# Patient Record
Sex: Male | Born: 1955 | Race: White | Hispanic: No | Marital: Married | Smoking: Never smoker
Health system: Southern US, Community
[De-identification: ages and names within clinical notes are randomized; demographics above are authoritative.]

## PROBLEM LIST (undated history)

## (undated) DIAGNOSIS — E7800 Pure hypercholesterolemia, unspecified: Secondary | ICD-10-CM

## (undated) DIAGNOSIS — I359 Nonrheumatic aortic valve disorder, unspecified: Secondary | ICD-10-CM

## (undated) DIAGNOSIS — I719 Aortic aneurysm of unspecified site, without rupture: Secondary | ICD-10-CM

## (undated) DIAGNOSIS — I10 Essential (primary) hypertension: Secondary | ICD-10-CM

## (undated) DIAGNOSIS — I119 Hypertensive heart disease without heart failure: Secondary | ICD-10-CM

## (undated) DIAGNOSIS — Q2543 Congenital aneurysm of aorta: Secondary | ICD-10-CM

## (undated) DIAGNOSIS — J309 Allergic rhinitis, unspecified: Secondary | ICD-10-CM

## (undated) HISTORY — DX: Hypertensive heart disease without heart failure: I11.9

## (undated) HISTORY — PX: CARDIAC CATHETERIZATION: SHX172

## (undated) HISTORY — DX: Allergic rhinitis, unspecified: J30.9

## (undated) HISTORY — DX: Congenital aneurysm of aorta: Q25.43

## (undated) HISTORY — PX: OTHER SURGICAL HISTORY: SHX169

## (undated) HISTORY — DX: Aortic aneurysm of unspecified site, without rupture: I71.9

## (undated) HISTORY — DX: Essential (primary) hypertension: I10

## (undated) HISTORY — DX: Nonrheumatic aortic valve disorder, unspecified: I35.9

## (undated) HISTORY — DX: Pure hypercholesterolemia, unspecified: E78.00

---

## 2006-03-21 HISTORY — PX: AORTIC VALVE REPLACEMENT: SHX41

## 2006-07-21 ENCOUNTER — Encounter: Admission: RE | Admit: 2006-07-21 | Discharge: 2006-07-21 | Payer: Self-pay | Admitting: Cardiology

## 2006-07-26 ENCOUNTER — Ambulatory Visit (HOSPITAL_COMMUNITY): Admission: RE | Admit: 2006-07-26 | Discharge: 2006-07-26 | Payer: Self-pay | Admitting: Cardiology

## 2006-08-03 ENCOUNTER — Ambulatory Visit: Payer: Self-pay | Admitting: Cardiothoracic Surgery

## 2006-08-08 ENCOUNTER — Ambulatory Visit: Payer: Self-pay | Admitting: Cardiothoracic Surgery

## 2006-08-25 ENCOUNTER — Encounter: Admission: RE | Admit: 2006-08-25 | Discharge: 2006-08-25 | Payer: Self-pay | Admitting: Cardiothoracic Surgery

## 2006-08-25 ENCOUNTER — Ambulatory Visit: Payer: Self-pay | Admitting: Cardiothoracic Surgery

## 2006-09-12 ENCOUNTER — Ambulatory Visit: Payer: Self-pay | Admitting: Cardiothoracic Surgery

## 2006-09-12 ENCOUNTER — Inpatient Hospital Stay (HOSPITAL_COMMUNITY): Admission: RE | Admit: 2006-09-12 | Discharge: 2006-09-18 | Payer: Self-pay | Admitting: Cardiothoracic Surgery

## 2006-09-12 ENCOUNTER — Encounter: Payer: Self-pay | Admitting: Cardiothoracic Surgery

## 2006-09-13 ENCOUNTER — Ambulatory Visit: Admission: RE | Admit: 2006-09-13 | Discharge: 2006-09-13 | Payer: Self-pay | Admitting: Cardiothoracic Surgery

## 2006-10-06 ENCOUNTER — Ambulatory Visit: Payer: Self-pay | Admitting: Cardiothoracic Surgery

## 2006-10-12 ENCOUNTER — Encounter (HOSPITAL_COMMUNITY): Admission: RE | Admit: 2006-10-12 | Discharge: 2007-01-10 | Payer: Self-pay | Admitting: Cardiology

## 2006-11-24 ENCOUNTER — Ambulatory Visit: Payer: Self-pay | Admitting: Cardiothoracic Surgery

## 2007-08-31 ENCOUNTER — Ambulatory Visit: Payer: Self-pay | Admitting: Cardiothoracic Surgery

## 2007-08-31 ENCOUNTER — Encounter: Admission: RE | Admit: 2007-08-31 | Discharge: 2007-08-31 | Payer: Self-pay | Admitting: Cardiothoracic Surgery

## 2008-07-31 IMAGING — CT CT ANGIO CHEST
2 of 4 series · 18 of 36 positions shown · IV contrast ([ID] OMNI 300)
Comparison: Plain films 06-27/3117.  CT 08/25/2006.

CLINICAL DATA: AORTIC VALVE DISEASE WITH DILATED AORTIC ROOT.

CHEST CTA WITH AND WITHOUT CONTRAST
TECHNIQUE: Multidetector CTA  imaging of the chest was performed
following the standard protocol both before and during the
administration of IV contrast. Multiplanar reformats were
constructed for perspective.
Contrast: 100 ml Dmnipaque-AUU

[Series 5: angio · axial · 0.70mm/px · z∈[-308,+4]mm · 15 of 141 slices shown]
[im 8/141  lung]
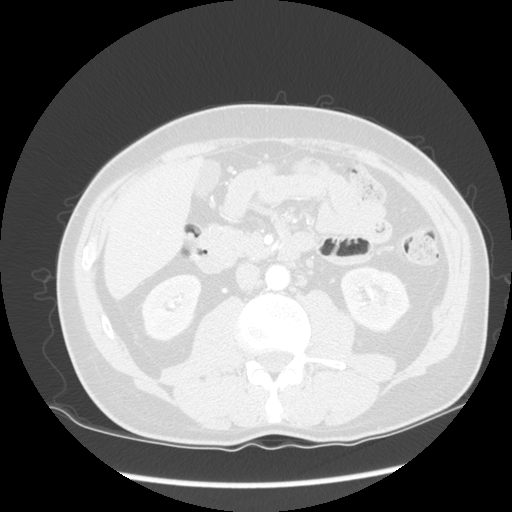
[im 16/141  mediastinal]
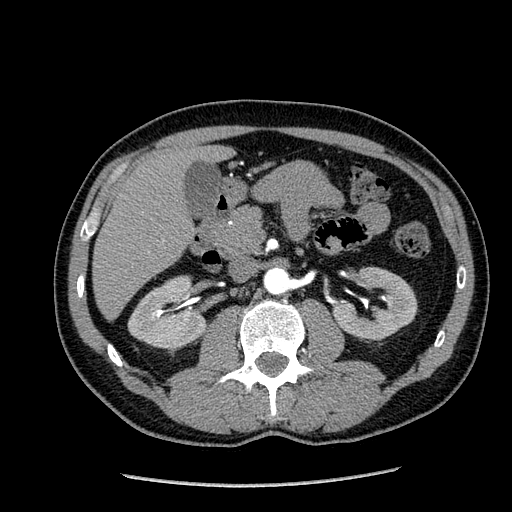
[im 24/141  lung]
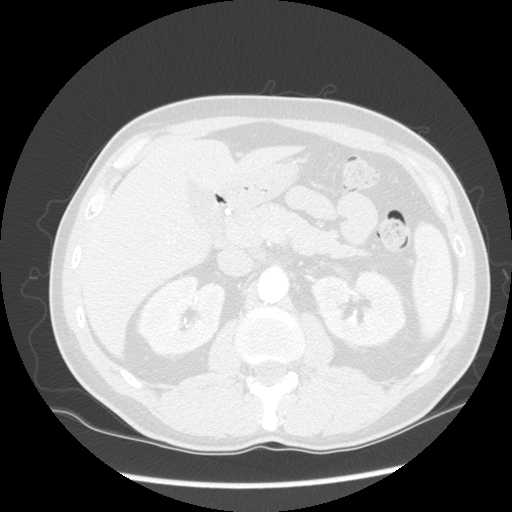
[im 32/141  mediastinal]
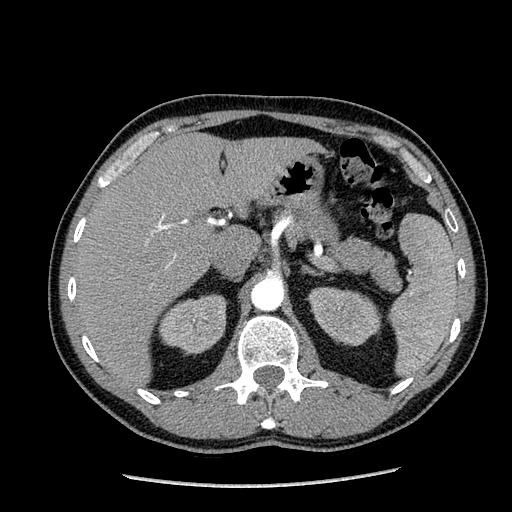
[im 47/141  lung]
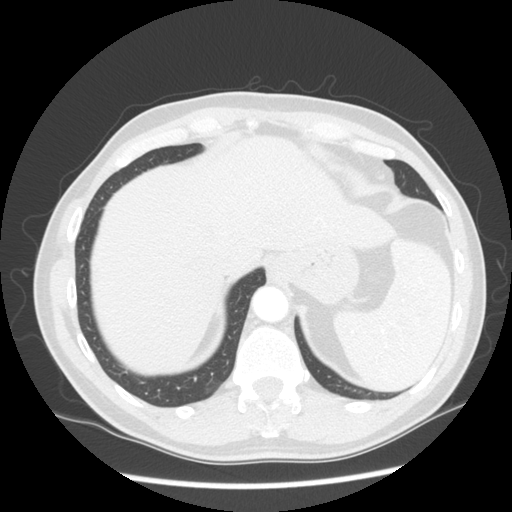
[im 55/141  mediastinal]
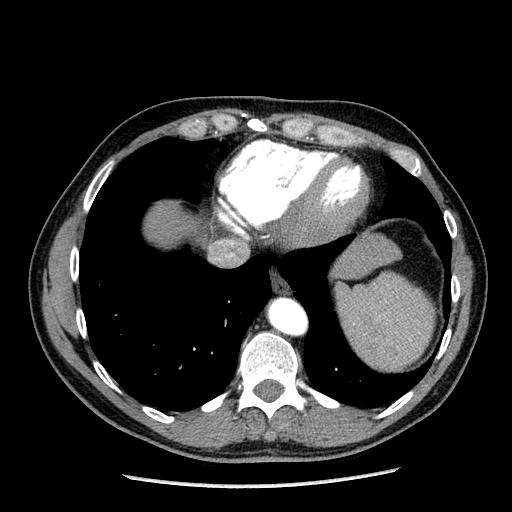
[im 63/141  lung]
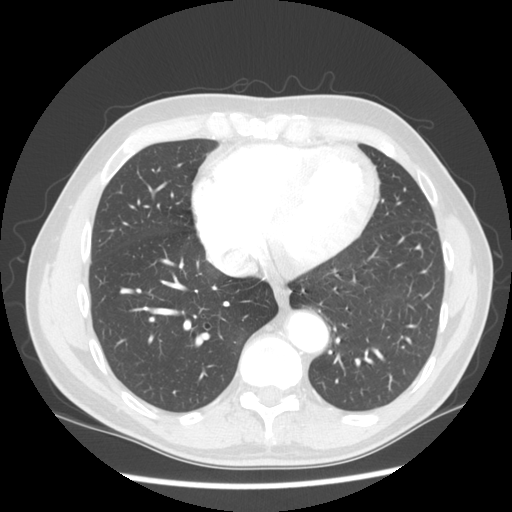
[im 71/141  mediastinal]
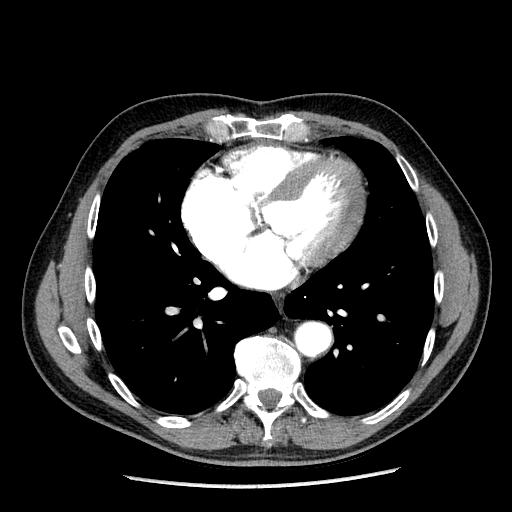
[im 78/141  lung]
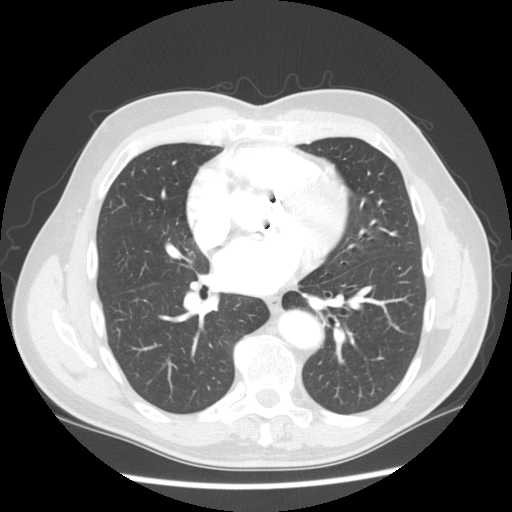
[im 86/141  mediastinal]
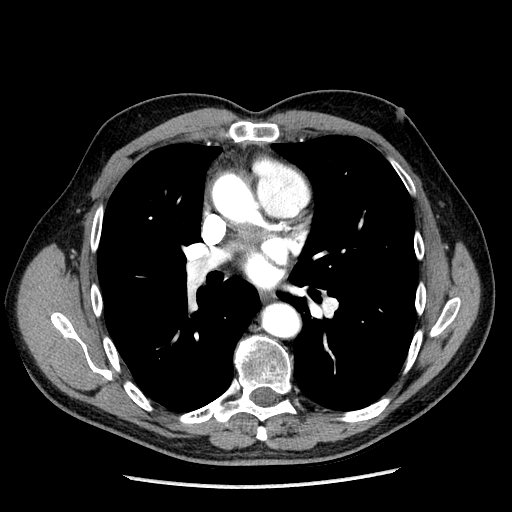
[im 94/141  lung]
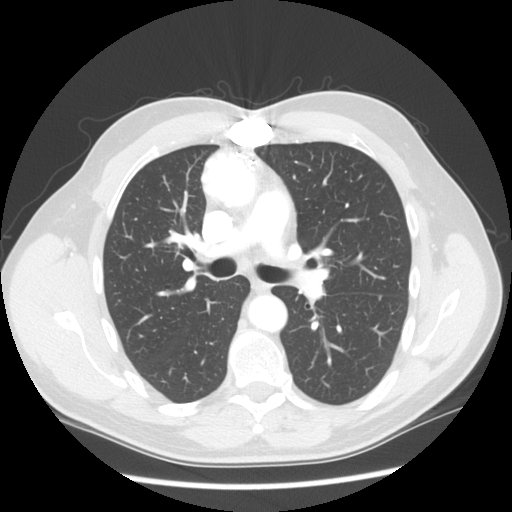
[im 109/141  mediastinal]
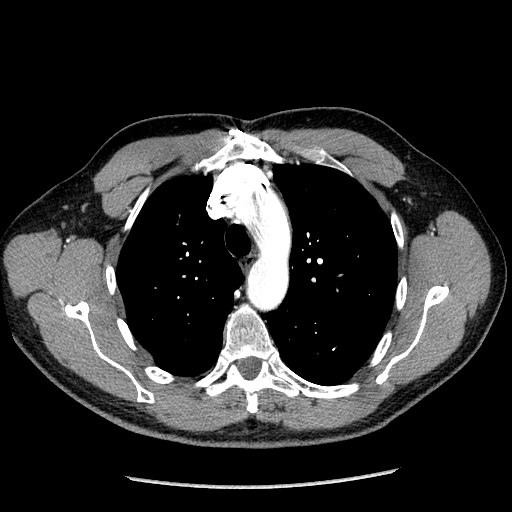
[im 117/141  lung]
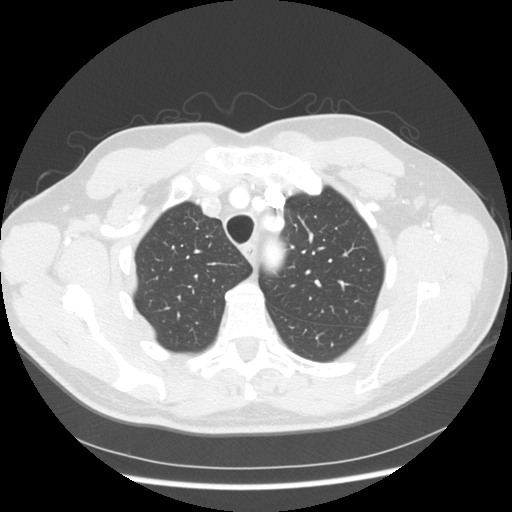
[im 125/141  mediastinal]
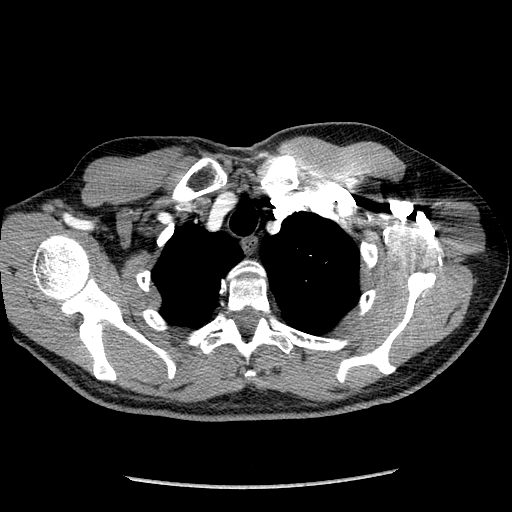
[im 133/141  lung]
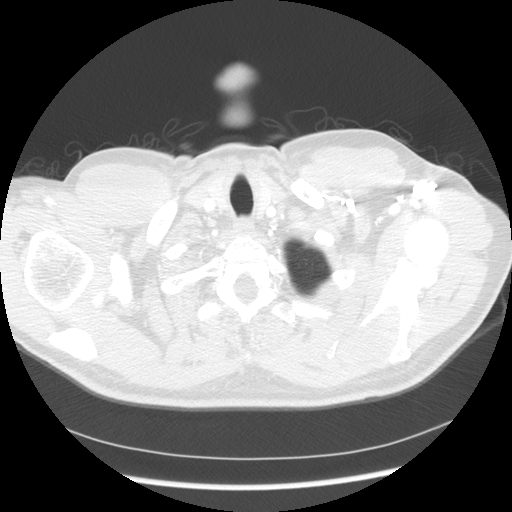

[Series 602: sagittal body · sagittal · 0.70mm/px · 3 of 145 slices shown]
[im 29/145  mediastinal]
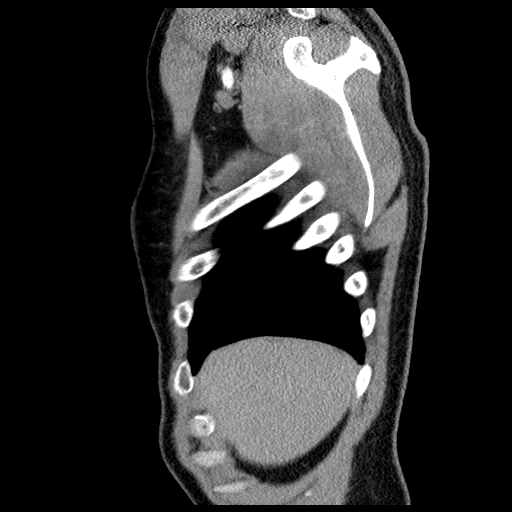
[im 58/145  mediastinal]
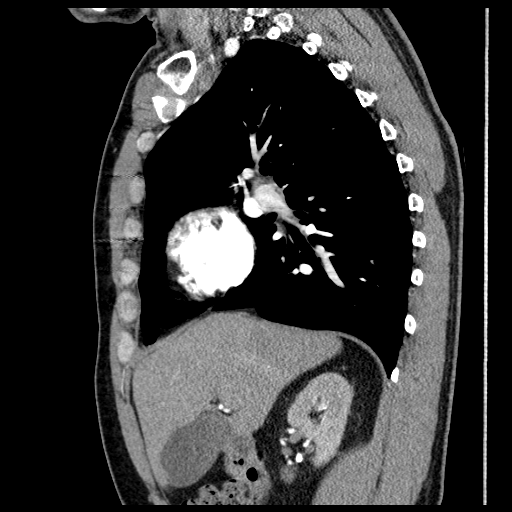
[im 87/145  mediastinal]
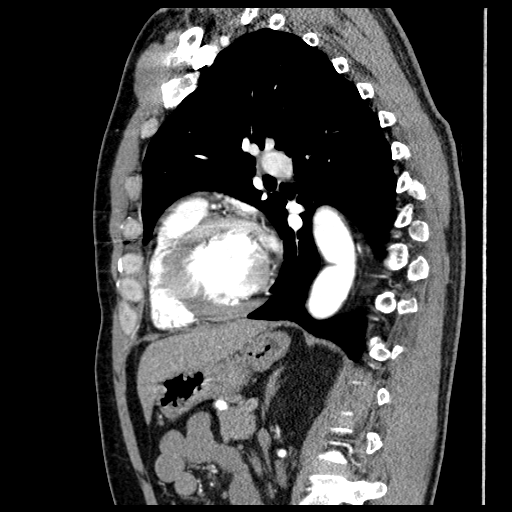

[18 of 36 positions shown; findings below may reference images not displayed]

FINDINGS: Lung windows demonstrate minimal pleural irregularity of
the left major fissure on image 25 of series 6, likely related to a
subpleural lymph node.  No suspicious nodules or airspace disease.

Soft tissue windows demonstrate no evidence of intramural hematoma.
Normal caliber of the great vessels.  Interval aortic valve
replacement with replacement of the proximal ascending aorta.  No
evidence of postoperative complication.  The distal ascending and
proximal transverse aorta are mildly prominent, measuring between
3.4 and 3.8 cm just above the surgical sutures.  Coronal image 43
and sagittal image 69.  On axial image 45, the ascending aorta
measures 4.1 x 3.7 cm just above the graft site.  The transverse
and descending aorta are normal in caliber.

Mild cardiomegaly with left ventricular hypertrophy.  No
pericardial or pleural effusion.  Advanced coronary artery
atherosclerosis including within the proximal LAD and either a
ramus branch or early D1 branch on image 57 of series 5. No
mediastinal or hilar adenopathy.

Limited abdominal imaging demonstrates 2 right renal arteries. No
other significant findings. Mild decreased height of mid thoracic
vertebral bodies is similar to on the prior exam and likely a
chronic finding.
IMPRESSION: 1.  Interval aortic valve and ascending aortic repair as described.
Mild prominence of the more distal ascending aorta just above the
graft insertion site.  No evidence of postoperative complication.
2.  No acute process in the chest.
3.  Age advanced coronary artery atherosclerosis as described
above.

## 2009-11-02 ENCOUNTER — Encounter: Admission: RE | Admit: 2009-11-02 | Discharge: 2009-11-02 | Payer: Self-pay | Admitting: Family Medicine

## 2010-04-11 ENCOUNTER — Encounter: Payer: Self-pay | Admitting: Cardiothoracic Surgery

## 2010-08-03 NOTE — Assessment & Plan Note (Signed)
OFFICE VISIT   Steven Bowers, Steven Bowers  DOB:  [birth year]                                        October 06, 2006  CHART #:  [id number]   CURRENT PROBLEMS:  1. Status post aortic root replacement with a valve-conduit Bentall      procedure on September 12, 2006.  2. History of aortic insufficiency with aortic root aneurysm.   HOME MEDICATIONS:  1. Coumadin 7.5 mg a day.  2. Lopressor 25 mg b.i.d.  3. Iron 150 mg daily.  4. Folic acid 1 mg daily.  5. Tylenol for p.r.n. pain.  6. Aspirin 81 mg daily.   PRESENT ILLNESS:  The patient is a 55 year old male who had severe  aortic insufficiency with an aortic root aneurysm.  He underwent a  Bentall root replacement with a St. Jude 23 mm valve conduit with a  resection of a fusiform ascending aneurysm and reimplantation of the  coronaries.  He did well following surgery and remained in sinus rhythm,  and was discharged home on the 5th postoperative day on the above  medications.  Since returning home he has had progressive improvement in  his exercise tolerance and decreased incisional discomfort.  His  Coumadin has been regulated by the [facility] pharmacist, and he  is currently taking 7.5 mg a day, with last INR approximately 2.0.  He  denies any bleeding complications.  He denies any angina or symptoms of  CHF.  The surgical incision has healed well.   PHYSICAL EXAMINATION:  Blood pressure 128/80, pulse 78, respirations 18,  saturation 100% on room air.  General appearance is that of a pleasant,  middle-aged male in no distress.  Breath sounds are clear and equal.  The sternum is stable.  Cardiac rhythm is regular and there is a sharp  aortic valve closure sound without aortic insufficiency.  There is no S3  gallop, there is no friction rub.  Ankles show no edema and neurological  exam is intact.  Peripheral pulses are intact.   LABORATORY DATA:  A PA and lateral chest x-ray shows clear lung fields  with a  stable cardiac silhouette, and the sternal wires are  nondisplaced.   IMPRESSION AND PLAN:  The patient has done well early following a  Bentall procedure for a root aneurysm with severe aortic insufficiency.  He will be able to start driving and routine low-level daily activities.  He is walking 20 minutes twice a day.  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  He  will probably need a CT angiogram of the thoracic aorta approximately 1  year after the operation which we will arrange.  Thank you for the  opportunity to participate in his care.   Kerin Perna, M.D.  Electronically Signed   PV/MEDQ  D:  10/06/2006  T:  10/07/2006  Job:  [id number]   cc:   Armanda Magic, M.D.  Robert A. Nicholos Johns, M.D.

## 2010-08-03 NOTE — Cardiovascular Report (Signed)
Steven Bowers, Steven Bowers              ACCOUNT NO.:  [id number]   MEDICAL RECORD NO.:  [id number]          PATIENT TYPE:  OIB   LOCATION:  2852                         FACILITY:  [facility]   PHYSICIAN:  Armanda Magic, M.D.     DATE OF BIRTH:  February 20, 1956   DATE OF PROCEDURE:  07/26/2006  DATE OF DISCHARGE:                            CARDIAC CATHETERIZATION   REFERRING PHYSICIAN:  Molly Maduro A. Nicholos Johns, M.D.   PROCEDURE:  Left heart catheterization, coronary angiography, left  ventriculography, aortography.   OPERATOR:  Armanda Magic, M.D.   INDICATIONS:  Aortic insufficiency.   COMPLICATIONS:  None.   INTRAVENOUS MEDICATIONS:  Versed 2 mg IV.   HISTORY OF PRESENT ILLNESS:  This is a very pleasant 55 year old white  male with a history of bicuspid aortic valve and aortic insufficiency  dating back to an echo in 2000.  He presented recently for further  evaluation.  He is asymptomatic, but a 2-D echocardiogram showed severe  aortic insufficiency with mildly dilated left ventricular cavity and  mild LV dysfunction, as well as dilated aortic root.  He now presents  for cardiac catheterization.   DESCRIPTION OF PROCEDURE:  The patient is brought to the cardiac  catheterization laboratory in a fasting, non sedated state.  Informed  consent was obtained.  The patient was connected to continuous heart  rate and pulse oximetry monitoring and blood pressure monitoring.  The  right groin was prepped and draped in a sterile fashion and 1% Xylocaine  was used for local anesthesia.  Using modified Seldinger technique a 6-  French sheath was placed in the right femoral artery.  Under  fluoroscopic guidance a 6-French JL-4 catheter was placed in left  coronary artery but we could not adequately engage the coronary ostium.  The catheter was exchanged out over a guidewire for a 6-French JR-4  right coronary artery catheter.  This again could not engage the  coronary ostium.  This catheter was exchanged out  over a guidewire for 6-  [place] JL-5 catheter which again could not successfully engage the  coronary ostium of the left coronary artery.  The catheter was then  exchanged out over a guidewire a 6-French angled pigtail catheter was  placed in left ventricular cavity.  Under fluoroscopy left  ventriculography was performed in the 30 degrees RAO view using a total  of 30 mL of contrast at 15 mL per second.  The catheter was then pulled  back across the aortic valve with no significant gradient noted.  The  catheter was then pulled back into the aortic root and an aortography  was performed using a total of 30 mL of contrast at 20 mL per second.  The catheter was then removed over a guide wire and a 6-French JL-6  catheter was placed into the left coronary ostium and multiple Scinti  films taken at 30 degree RAO and 40 LAO views.  Catheter was then  exchanged out over a guidewire for 6-French Williams right catheter  which could not successfully engage the coronary ostium.  The catheter  was exchanged out over a guidewire for 6-French JR-6  catheter which  successfully engaged the right coronary ostium.  Multiple Scinti films  were taken at 30 degree RAO and 40 degree LAO views.  This catheter was  then removed over a guidewire and at the end of the procedure all  catheters and sheaths were removed.  Manual compression was performed  until an adequate hemostasis was obtained.  The patient was transferred  back to room in stable condition.   RESULTS:  Left ventriculography shows low normal LV function, EF 50%,  dilated aortic root with severe AI by echo; by cath the contrast did  opacify, but lightly opacified, the left ventricle in the first cardiac  cycle.  Left ventricular pressure 126/3 mmHg; aortic pressure 125/58  mmHg.   The right coronary artery is widely patent and bifurcates into the  posterior descending artery and posterior lateral artery, both of which  are widely patent.    Left coronary artery is widely patent and trifurcates into a left  anterior descending artery, ramus branch and left circumflex artery.   The left anterior descending artery is widely patent throughout its  course at the apex giving rise to two diagonal branches, both of which  are widely patent.   The ramus is a large vessel that is widely patent.   The left circumflex is widely patent throughout its course in the AV  groove, giving rise to a first obtuse marginal branch which is widely  patent and terminating in a second obtuse marginal branch, which is  widely patent.   ASSESSMENT:  1. Normal coronary arteries.  2. Low normal left ventricular function.  3. Moderate to severe aortic insufficiency by echocardiogram with      bicuspid aortic valve.  4. Dilated aortic root.   PLAN:  CVTS referral for aortic valve and root replacement.      Armanda Magic, M.D.  Electronically Signed     TT/MEDQ  D:  07/26/2006  T:  07/26/2006  Job:  [id number]   cc:   Molly Maduro A. Nicholos Johns, M.D.

## 2010-08-03 NOTE — Discharge Summary (Signed)
NAME:  Steven Bowers, Steven Bowers NO.:  [id number]   MEDICAL RECORD NO.:  [id number]          PATIENT TYPE:  INP   LOCATION:  2017                         FACILITY:  [facility]   PHYSICIAN:  Kerin Perna, M.D.  DATE OF BIRTH:  [birth year]   DATE OF ADMISSION:  09/12/2006  DATE OF DISCHARGE:  09/18/2006                               DISCHARGE SUMMARY   ADDENDUM:  The patient was tentatively felt to be stable for discharge  on or about September 18, 2006.  Please see the previous dictation for the  majority of the details of this hospitalization.  From the date September 15, 2006, to September 18, 2006, the patient remained quite stable and he  required daily adjustments of his Coumadin dosing in attempts to get  therapeutic INR.  On September 18, 2006, his INR was 1.4 and it was felt at  that time that it was acceptable for him to be discharged with a dose of  5 mg daily and the dose on the day of discharge 7.5 mg daily.  He had no  significant other difficulties or issues related to his hospitalization  otherwise not mentioned on the previous dictation.   DISCHARGE MEDICATIONS:  1. Aspirin 81 mg daily.  2. Lopressor 25 mg twice daily.  3. Altace 2.5 mg daily.  4. Tylox 1 or 2 every 4 to 6 hours as needed for pain.  5. Coumadin 5 mg daily.  6. NU-IRON 150 mg daily.  7. Folic acid 1 mg daily.  8. He is to continue his previous vitamins as listed.   INR was to be obtained on September 20, 2006, at Dr. Norris Cross office with an  appointment to be made with Dr. Mayford Knife in 2 weeks.  Followup with Dr.  Donata Clay was to be October 06, 2006, 11:30.      Rowe Clack, P.A.-C.      Kerin Perna, M.D.  Electronically Signed    WEG/MEDQ  D:  11/09/2006  T:  11/09/2006  Job:  [id number]   cc:   Gerlene Burdock A. Alanda Amass, M.D.  Kerin Perna, M.D.  Armanda Magic, M.D.

## 2010-08-03 NOTE — H&P (Signed)
HISTORY AND PHYSICAL EXAMINATION   August 25, 2006   Re:  REISS, MOWREY B        DOB:  22-Jul-1955   ADMISSION DIAGNOSES:  1. Moderate to severe aortic insufficiency with a bicuspid aortic      valve.  2. Fusiform ascending aortic aneurysm.  3. 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   HISTORY OF PRESENT ILLNESS:  Mr. Daimian Sudberry is a 55 year old white  male who has a known history of aortic insufficiency from a bicuspid  aortic valve and was catheterized by Dr. Mayford Knife in May showing normal  coronaries with a dilated aortic root measuring 4.5 cm.  The patient's  father died at age 80 of a sudden cardiac death, possible aortic  dissection.  The patient underwent a CT angiogram of his thoracic aorta,  which showed normal caliber of the aortic arch and descending thoracic  aorta.  His 2 D echocardiogram shows normal LV function without mitral  regurgitation.  The end-diastolic diameter of the left ventricle was  increased at 6.5 cm.  The patient returns today to discuss the operation  and schedule a date for surgery for a Bentall procedure.  Because of his  age of 58 years, a mechanical valve conduit has been recommended.   PAST MEDICAL HISTORY:  1. Moderate to severe AI with fusiform ascending aneurysm.  2. Varicose veins of the right lower extremity.  3. No known drug allergies.   CURRENT MEDICATIONS:  1. Aspirin 81 mg.  2. Multivitamin.  3. Vitamin D.  4. Vitamin C.   SOCIAL HISTORY:  The patient works in maintenance for the [employer] and is on a summer leave of absence for his surgery  at this point.  He does not smoke or use alcohol.  He is married.   FAMILY HISTORY:  His father died of a sudden cardiac death at age 20.  No other siblings or family members have known bicuspid aortic valve  disease.   REVIEW OF SYSTEMS:  Negative constitutional systems of fever or weight  loss.  ENT:  Negative, and he see a dentist  regularly.  He always takes  amoxicillin dental prophylaxis.  He has no history of thoracic trauma.  UROLOGIC:  Negative for BPH or kidney disease.  HEMATOLOGIC:  Negative  for bleeding disorder or blood transfusion.  He has had no evidence of  severe headaches or cerebral aneurysm disease.  He has no  contraindication to chronic Coumadin therapy after review of his past  medical history or review of systems.   PHYSICAL EXAMINATION:  VITAL SIGNS:  The patient is 5 feet 7 inches and  weighs 155 pounds.  Blood pressure 144/60, pulse 60, respirations 18,  saturation 98%.  GENERAL:  He is alert and oriented, pleasant.  He is accompanied by his  wife.  HEENT:  Normocephalic.  Dentition is good.  NECK:  Without JVD, mass or carotid bruit.  LYMPHATIC:  No palpable supraclavicular or cervical adenopathy.  CHEST:  Thorax is without deformity and with clear breath sounds  bilaterally.  CARDIAC:  A 3-4/6 systolic ejection murmur.  There is no gallop.  ABDOMEN:  Soft without pulsatile mass.  EXTREMITIES:  No clubbing, cyanosis, or edema.  Peripheral pulses are  2+, and there is no venous insufficiency of the left leg but severe  venous varicosities of the right leg.  NEUROLOGIC:  Intact.   LABORATORY DATA:  I reviewed the CT scan he had today and  the transverse  arch appears to be of normal caliber.  It appears that a Bentall  procedure will be the best therapy and that the ascending aorta should  be able to accept a clamp at the innominate artery.  This would  eliminate the need for circulatory arrest.   IMPRESSION AND PLAN:  The patient has moderate to severe AI with a  fusiform ascending aneurysm and a dilated left ventricle.  He would  benefit from a Bentall procedure, which will be scheduled for September 12, 2006.  I have discussed the procedure in detail with the patient and his  wife, including the alternatives to surgery and the risks involved.  He  understands that a mechanical valve  conduit would necessitate a lifelong  commitment to Coumadin  anticoagulation.  All questions have been addressed and the patient  understands and agrees to proceed.   Kerin Perna, M.D.  Electronically Signed   PV/MEDQ  D:  08/25/2006  T:  08/26/2006  Job:  [id number]   cc:   Armanda Magic, M.D.  [facility] Office

## 2010-08-03 NOTE — Assessment & Plan Note (Signed)
OFFICE VISIT   ZANE, PELLECCHIA  DOB:  [birth year]                                        November 24, 2006  CHART #:  [id number]   CURRENT PROBLEMS:  1. Status post aortic root replacement with a Bentall procedure, September 12, 2006.  2. History of AI with ascending root aneurysm.   HOME MEDICATIONS:  1. Coumadin 7.5 mg daily.  2. Lopressor 25 mg b.i.d.  3. Tylenol p.r.n. pain.  4. Aspirin 81 mg a day.   PRESENT ILLNESS:  The patient is a 55 year old male status post Bentall  procedure with a mechanical valve-conduit who is doing well now, 2-1/2  months postop.  He has completed his rehab and is feeling very strong,  and I feel is able to return to work.  He was given a return to work  form for later in September.  He has had no bleeding complications from  the Coumadin, and on exam his valve sounds fine with a stable blood  pressure of 118/70, sinus rhythm of 72 per minute.  He will be followed  by his cardiologist, Dr. Armanda Magic, and will be seen back here with a  CT angiogram of the chest next summer.   Kerin Perna, M.D.  Electronically Signed   PV/MEDQ  D:  11/24/2006  T:  11/25/2006  Job:  [id number]

## 2010-08-03 NOTE — Discharge Summary (Signed)
NAMELAURENCE, CROFFORD NO.:  [id number]   MEDICAL RECORD NO.:  [id number]          PATIENT TYPE:  INP   LOCATION:  2017                         FACILITY:  [facility]   PHYSICIAN:  Kerin Perna, M.D.  DATE OF BIRTH:  1955-07-10   DATE OF ADMISSION:  09/12/2006  DATE OF DISCHARGE:                               DISCHARGE SUMMARY   FINAL DIAGNOSIS:  Severe aortic insufficiency with aortic root aneurysm.   IN-HOSPITAL DIAGNOSIS:  Acute blood loss anemia, postoperatively.   SECONDARY DIAGNOSIS:  Varicose veins, right lower extremity.   IN-HOSPITAL OPERATIONS AND PROCEDURES:  _Bentall _  procedure with  aortic root replacement using St. Jude 23-mm conduit, reimplantation of  coronaries, and resection of fusiform aneurysm in the ascending aorta.   HISTORY AND PHYSICAL:  The patient is a 55 year old male with severe  aortic insufficiency and LV dilatation and early symptoms of dyspnea on  exertion.  Catheterization done showed severe AI which was confirmed by  echocardiogram.  His coronary arteries were within normal limits.  The  patient underwent CT angiogram of his thoracic aorta which demonstrated  ascending aorta measuring approximately 4.5-cm with a normal 4-cm arch,  and descending thoracic aorta.  There were no other valvular  abnormalities.  Dr. Donata Clay discussed with the patient undergoing  aortic root replacement.  He discussed the risks and benefits with the  patient.  The patient acknowledged his understanding and agreed to  proceed.  The surgery was scheduled for September 12, 2006.  For details of  the patient's past medical history and physical exam, please see  dictated H&P.   HOSPITAL COURSE:  The patient was taken to the operating room on September 12, 2006, where he underwent _Bentall__  procedure with aortic root  replacement using a St. Jude 23-mm conduit, reimplantation of the  coronaries, and resection of a fusiform aneurysm of his ascending aorta.  The patient tolerated the procedures well and was returned to the  intensive care unit in stable condition.  The patient's postoperative course was pretty much unremarkable.  Immediately postoperatively the patient was noted to be hemodynamic  stable.  He was extubated the evening of surgery.  Post extubation, the  patient noted to be alert and oriented  x4, neuro intact.  During the  patient's postoperative course, vital signs were monitored closely.  They remained stable.  The patient remained afebrile _with_  him being  able to be weaned off oxygen sating greater than 90% on room air.  From  a pulmonary standpoint, postoperative chest x-ray remained clear with no  pneumothorax or significant pleural effusions.  The patient remained in  a normal sinus rhythm postoperatively.  Incisions are clean, dry,  intact, and healing well.  The patient did develop acute blood loss  anemia with hemoglobin/hematocrit dropping to 9.3 and 26.8 on post-op  day #3.  The patient was asymptomatic and this was followed closely.  Postoperatively, the patient was out of bed ambulating well.  He was  tolerating a diet well with no nausea or vomiting noted.  The patient  was transferred out to  2000 post-op day #2.  He was started on Coumadin  post-op day #2 for the [facility]. Jude aortic valve/conduit.  Daily PT INR were  obtained.  ___Baseline  INR is 1.1.  The patient is tentatively ready  for discharge home in the next 1-2 days if he remains stable.   FOLLOWUP APPOINTMENTS:  1. Followup appointment has been arranged with Dr. Donata Clay for October 06, 2006 at 11:30 a.m.  2. The patient will need to follow up with Dr. Mayford Knife in 2 weeks.  He      will need to contact Dr. Mayford Knife to arrange this appointment.  Dr.      Mayford Knife will be managing the patient's Coumadin and the patient will      need to obtain PT INR two days post discharge.   ACTIVITY:  1. The patient instructed no driving till released to do so.  2.  No heavy lifting over 10 pounds.  3. He is told to ambulate 3-4 times per day, progress as tolerated.  4. Continue breathing exercises.   INCISIONAL CARE:  The patient is told to shower, washing his incisions  using soap and water.  He is to contact the office if he develops any  drainage or opening from any of his incision sites.   DIET:  The patient is educated on diet to be low-fat, low-salt.   DISCHARGE MEDICATIONS:  1. Aspirin 81 mg daily.  2. Lopressor 25 mg b.i.d.  3. Altace 2.5 mg daily.  4. Coumadin, will be dosed based on the patient's discharge PT INR      level.  5. Nu-Iron 150 mg daily.  6. Folic acid 1 mg daily.  7. Multivitamin daily.  8. Tylox 1-2 tabs q.4-6h. p.r.n. pain.      Theda Belfast, [place]      Kerin Perna, M.D.  Electronically Signed    KMD/MEDQ  D:  09/15/2006  T:  09/15/2006  Job:  [id number]   cc:   Gerlene Burdock A. Alanda Amass, M.D.  Armanda Magic, M.D.

## 2010-08-03 NOTE — Assessment & Plan Note (Signed)
OFFICE VISIT   ARAS, ALBARRAN  DOB:  09-01-1955                                        September 01, 2007  CHART #:  [id number]   CURRENT PROBLEMS:  1. 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  2. Chronic Coumadin therapy for the mechanical prosthetic valve.  3. Mild hypertension.   HISTORY OF PRESENT ILLNESS:  The patient returns for a 1-year followup  after undergoing a mechanical valve - conduit aortic root replacement  for AI and an ascending aneurysm.  He has done well since the surgery  and is back to work full-time for the [employer].  He has had no chest pain or symptoms of CHF or  complications from his Coumadin.  He has had a postoperative echo by Dr.  Mayford Knife and was told the valve appears to be functioning well.   CURRENT MEDICATIONS:  Coumadin, aspirin 81 mg, pravastatin 40 mg, and  Lopressor 25 mg b.i.d.   PHYSICAL EXAMINATION:  Blood pressure 135/80, pulse 60, respirations 18,  and saturation 99%.  He is alert and pleasant.  Breath sounds are clear  and equal.  The sternum is well healed.  There is a sharp valve click  sound and no murmur of aortic insufficiency.  His heart rhythm is  regular.  There is no peripheral edema.   LABORATORY DATA:  A CT angiogram of his thorax is reviewed to assess the  residual aortic arch and descending thoracic aorta.  The valve - conduit  is completely normal in appearance without evidence of false aneurysm.  The remainder arch and descending thoracic aorta are of normal caliber,  approximately 3 cm in diameter.   IMPRESSION AND PLAN:  The patient has had a successful replacement of  the mechanical valve - conduit that should last him indefinitely.  There  is no evidence of further aortic anomalies or abnormalities.  He will  return to the care of his medical  physicians.  I would be happy to see  this very nice gentleman in the future for any surgical issues.   Kerin Perna, M.D.  Electronically Signed   PV/MEDQ  D:  09/01/2007  T:  09/01/2007  Job:  [id number]   cc:   Armanda Magic, M.D.  Robert A. Nicholos Johns, M.D.

## 2010-08-03 NOTE — Op Note (Signed)
NAME:  Steven Bowers, Steven Bowers NO.:  [id number]   MEDICAL RECORD NO.:  [id number]          PATIENT TYPE:  INP   LOCATION:  2306                         FACILITY:  [facility]   PHYSICIAN:  Kerin Perna, M.D.  DATE OF BIRTH:  28-Sep-1955   DATE OF PROCEDURE:  09/12/2006  DATE OF DISCHARGE:                               OPERATIVE REPORT   OPERATION:  Bentall procedure with aortic root replacement using a St.  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   SURGEON:  Kerin Perna, M.D.   ASSISTANT:  Coral Ceo, PA-C   ANESTHESIA:  General.   INDICATIONS:  This patient is a 55 year old male with severe aortic  insufficiency and LV dilatation and early symptoms of dyspnea on  exertion.  The cath showed severe AI and this was confirmed by echo.  His coronaries were normal.  I saw the patient in the office and  performed a CT angiogram of his thoracic aorta which demonstrated a  ascending aorta measuring approximately 4.5 cm with a normal 4 cm arch  and descending thoracic aorta.  There were no other valvular  abnormalities.  It was felt the patient would benefit from the aortic  root replacement.  I discussed the procedure in detail with the patient  and his wife including the preference for a mechanical valve - conduit  due to his age 31 years.  He understood this would require lifelong  commitment to Coumadin anticoagulation and there were no clinical  contraindications present from my review of his medical history.  I  discussed the major aspects of the actual operation including the  location of the surgical incision.  The use of general anesthesia and  cardiopulmonary bypass, and expected recovery.  I reviewed the potential  risks including risks of MI, stroke, bleeding, blood transfusion  requirement, infection, and death.  He understood these implications for  the surgery and agreed to proceed with the operation as planned under  what I felt was an informed consent.   OPERATIVE FINDINGS:  He had severe AI.  The aortic valve was tricuspid  with thick and insufficient leaflets.  The ascending aorta replaced up  to the level of the innominate artery where a crossclamp was applied and  the aorta became more normal in diameter.  A St. Jude valve conduit was  used,  serial number [id number], model number 23 CAVGJ - 514.  Total  crossclamp time was 140 minutes and pump time was 170 minutes.   PROCEDURE:  The patient was brought to operative room, placed on the  operating table where general anesthesia was induced under invasive  hemodynamic monitoring.  The chest, abdomen and legs were prepped with  Betadine and draped as a sterile field.  A sternal incision was made.  The pericardium was opened and suspended.  The sternal retractor was  placed.  Heparin was administered.  Pursestrings placed in the ascending  aorta and right  atrium and the patient was cannulated placed on bypass.  The aortic cannulas placed in the aortic arch above the innominate  artery to allow crossclamp at the level the innominate artery.  A left  ventricular vent was placed via the right superior pulmonary vein and  the pulmonary artery was dissected from the aorta.  Cardioplegia  catheters were placed for both antegrade aortic and retrograde coronary  sinus cardioplegia.  The patient was cooled to 32 degrees and aortic  crossclamp was applied.  800 mL of cold cardioplegia with was delivered  with some antegrade, but mostly with retrograde cold blood cardioplegia  due to his severe aortic insufficiency.  There is good cardioplegic  arrest and septal temperature dropped less than 14 degrees.  Cardioplegia was delivered every 20 minutes during the crossclamp  period.   The aorta was opened and resected from  just below the crossclamp to the  sinotubular junction.  Coronary buttons were developed from the left  main and right coronary ostium and these were retracted away with stay  sutures.  The remainder of the aortic root was resected with the  exception of the annulus and the commissural posts.  Aortic valve was  resected and the annulus was sized to 23 mm St. Jude valve.  Super  annular 2-0 pledgeted Ethibond sutures were placed around the annulus  measuring 19 total sutures.  These were then placed through the sewing  ring of the valve conduit and the valve conduit was seated and sutures  were tied.  Next the left main coronary button was sewn to the posterior  aspect of the conduit with care being taken to avoid kinking of the  vessel.  A running 6-0 Prolene was used to construct the anastomosis.  The anastomosis was checked inside the graft was widely patent.  Next  the graft was cut to the appropriate orientation and length to be  anastomosed to the distal aorta just at the level of the innominate  artery.  This was constructed using a running #4-0 Prolene with  interrupted reinforced 4-0 pledgeted Prolene around the circumference of  the anastomosis.  These were placed inside the aorta on the posterior  aspect and outside the aorta anteriorly.  Finally the right coronary  ostial button was sewn to the anterior aspect of the conduit using a  running 6-0 Prolene with care being taken avoid kinking of the vessel.  The patient was then rewarmed and a dose of retrograde warm blood  cardioplegia was delivered as well as usual de-airing maneuvers and air  was vented through the graft at the site of the right coronary button  before it was tied down.  The coronary button was tied to and the  crossclamp was then removed.   The heart was cardioverted back to a regular rhythm and the cardioplegia  cannulas were removed.  A new vent was placed in the ascending aorta above the distal graft  anastomosis to remove any residual air.  The  patient was rewarmed and reperfused.  The anastomoses were all checked  and were hemostatic.  After the temporary wires were placed and the  patient had been adequately rewarmed and reperfused, lungs re-expanded,  the ventilator was resumed.  The patient was then weaned from bypass  with preservation of LV function.  Protamine was administered without  adverse reaction.  There was persistent diffuse bleeding and despite  normalization of the ACT with protamine the patient's DIC screen showed  severe abnormalities with  a platelet count of 80,000, INR of 1.9 and a  fibrinogen of 80.  For that reason the patient was given FFP and  platelets with improvement in coagulation function.  No packed cells  were required.  The mediastinum was irrigated with warm antibiotic  irrigation.  The anterior mediastinal fat was closed over the aortic  graft.  Two mediastinal chest tubes were placed.  The sternum was closed  interrupted steel wire.  The pectoralis fascia was closed in running #1  Vicryl and subcutaneous skin was closed with a running Vicryl.  The  patient returned the ICU in stable condition.      Kerin Perna, M.D.  Electronically Signed     PV/MEDQ  D:  09/12/2006  T:  09/13/2006  Job:  [id number]   cc:   Armanda Magic, M.D.  [facility]

## 2011-01-05 LAB — PREPARE CRYOPRECIPITATE

## 2011-01-05 LAB — POCT I-STAT 4, (NA,K, GLUC, HGB,HCT)
Glucose, Bld: 101 — ABNORMAL HIGH
Glucose, Bld: 101 — ABNORMAL HIGH
Glucose, Bld: 113 — ABNORMAL HIGH
Glucose, Bld: 144 — ABNORMAL HIGH
Glucose, Bld: 95
HCT: 22 — ABNORMAL LOW
HCT: 23 — ABNORMAL LOW
HCT: 26 — ABNORMAL LOW
HCT: 26 — ABNORMAL LOW
HCT: 38 — ABNORMAL LOW
Hemoglobin: 12.9 — ABNORMAL LOW
Hemoglobin: 7.5 — CL
Hemoglobin: 7.8 — CL
Hemoglobin: 8.8 — ABNORMAL LOW
Hemoglobin: 8.8 — ABNORMAL LOW
Operator id: 195151
Operator id: 3406
Operator id: 3406
Operator id: 3406
Operator id: 3406
Potassium: 3.7
Potassium: 4
Potassium: 4.3
Potassium: 4.4
Potassium: 6.1 — ABNORMAL HIGH
Sodium: 132 — ABNORMAL LOW
Sodium: 134 — ABNORMAL LOW
Sodium: 136
Sodium: 137
Sodium: 142

## 2011-01-05 LAB — POCT I-STAT 3, ART BLOOD GAS (G3+)
Acid-base deficit: 1
Acid-base deficit: 2
Acid-base deficit: 2
Acid-base deficit: 3 — ABNORMAL HIGH
Bicarbonate: 21.6
Bicarbonate: 22.3
Bicarbonate: 23.2
Bicarbonate: 23.7
O2 Saturation: 100
O2 Saturation: 100
O2 Saturation: 100
O2 Saturation: 95
Operator id: 195151
Operator id: 203371
Operator id: 3406
Operator id: 3406
Patient temperature: 35.3
Patient temperature: 37
TCO2: 23
TCO2: 23
TCO2: 24
TCO2: 25
pCO2 arterial: 35.6
pCO2 arterial: 35.7
pCO2 arterial: 37.2
pCO2 arterial: 38.5
pH, Arterial: 7.39
pH, Arterial: 7.395
pH, Arterial: 7.398
pH, Arterial: 7.404
pO2, Arterial: 172 — ABNORMAL HIGH
pO2, Arterial: 249 — ABNORMAL HIGH
pO2, Arterial: 545 — ABNORMAL HIGH
pO2, Arterial: 69 — ABNORMAL LOW

## 2011-01-05 LAB — BASIC METABOLIC PANEL
BUN: 17
BUN: 19
BUN: 9
CO2: 26
CO2: 30
CO2: 30
Calcium: 7.9 — ABNORMAL LOW
Calcium: 8.4
Calcium: 8.5
Chloride: 103
Chloride: 110
Chloride: 96
Creatinine, Ser: 0.89
Creatinine, Ser: 0.98
Creatinine, Ser: 1.1
GFR calc Af Amer: >60
GFR calc Af Amer: >60
GFR calc Af Amer: >60
GFR calc non Af Amer: >60
GFR calc non Af Amer: >60
GFR calc non Af Amer: >60
Glucose, Bld: 111 — ABNORMAL HIGH
Glucose, Bld: 122 — ABNORMAL HIGH
Glucose, Bld: 127 — ABNORMAL HIGH
Potassium: 3.9
Potassium: 4
Potassium: 4.2
Sodium: 134 — ABNORMAL LOW
Sodium: 137
Sodium: 139

## 2011-01-05 LAB — COMPREHENSIVE METABOLIC PANEL
ALT: 25
AST: 32
Albumin: 4.3
Alkaline Phosphatase: 65
BUN: 16
CO2: 23
Calcium: 8.8
Chloride: 105
Creatinine, Ser: 1.08
GFR calc Af Amer: >60
GFR calc non Af Amer: >60
Glucose, Bld: 96
Potassium: 4.2
Sodium: 135
Total Bilirubin: 1.2
Total Protein: 6.6

## 2011-01-05 LAB — PREPARE PLATELET PHERESIS

## 2011-01-05 LAB — POCT I-STAT 7, (LYTES, BLD GAS, ICA,H+H)
Bicarbonate: 24.9 — ABNORMAL HIGH
Calcium, Ion: 1.04 — ABNORMAL LOW
HCT: 28 — ABNORMAL LOW
Hemoglobin: 9.5 — ABNORMAL LOW
O2 Saturation: 100
Operator id: 156951
Patient temperature: 35.5
Potassium: 3.7
Sodium: 144
TCO2: 26
pCO2 arterial: 37.4
pH, Arterial: 7.425
pO2, Arterial: 448 — ABNORMAL HIGH

## 2011-01-05 LAB — PREPARE FRESH FROZEN PLASMA

## 2011-01-05 LAB — CREATININE, SERUM
Creatinine, Ser: 0.87
Creatinine, Ser: 0.94
GFR calc Af Amer: >60
GFR calc Af Amer: >60
GFR calc non Af Amer: >60
GFR calc non Af Amer: >60

## 2011-01-05 LAB — I-STAT 8, (EC8 V) (CONVERTED LAB)
Acid-base deficit: 8 — ABNORMAL HIGH
BUN: 10
BUN: 13
Bicarbonate: 17.4 — ABNORMAL LOW
Bicarbonate: 25.3 — ABNORMAL HIGH
Chloride: 102
Chloride: 91 — ABNORMAL LOW
Glucose, Bld: 130 — ABNORMAL HIGH
Glucose, Bld: 96
HCT: 18 — ABNORMAL LOW
HCT: 23 — ABNORMAL LOW
Hemoglobin: 6.1 — CL
Hemoglobin: 7.8 — CL
Operator id: 128731
Operator id: 128731
Potassium: 3 — ABNORMAL LOW
Potassium: 4.2
Sodium: 124 — ABNORMAL LOW
Sodium: 137
TCO2: 18
TCO2: 27
pCO2, Ven: 32.1 — ABNORMAL LOW
pCO2, Ven: 42.3 — ABNORMAL LOW
pH, Ven: 7.344 — ABNORMAL HIGH
pH, Ven: 7.385 — ABNORMAL HIGH

## 2011-01-05 LAB — BLOOD GAS, ARTERIAL
Acid-base deficit: 0.1
Bicarbonate: 23.3
Drawn by: 274481
FIO2: 0.21
O2 Saturation: 99.3
Patient temperature: 98.6
TCO2: 24.3
pCO2 arterial: 33.5 — ABNORMAL LOW
pH, Arterial: 7.457 — ABNORMAL HIGH
pO2, Arterial: 122 — ABNORMAL HIGH

## 2011-01-05 LAB — POCT I-STAT GLUCOSE
Glucose, Bld: 107 — ABNORMAL HIGH
Glucose, Bld: 120 — ABNORMAL HIGH
Glucose, Bld: 128 — ABNORMAL HIGH
Glucose, Bld: 86
Glucose, Bld: 97
Operator id: 156951
Operator id: 156951
Operator id: 3406
Operator id: 3406
Operator id: 3406

## 2011-01-05 LAB — I-STAT EC8
BUN: 10
Bicarbonate: 24.9 — ABNORMAL HIGH
Chloride: 106
Glucose, Bld: 131 — ABNORMAL HIGH
HCT: 24 — ABNORMAL LOW
Hemoglobin: 8.2 — ABNORMAL LOW
Operator id: 203371
Potassium: 4.3
Sodium: 142
TCO2: 26
pCO2 arterial: 41.6
pH, Arterial: 7.384

## 2011-01-05 LAB — CBC
HCT: 24.3 — ABNORMAL LOW
HCT: 24.4 — ABNORMAL LOW
HCT: 26 — ABNORMAL LOW
HCT: 26.2 — ABNORMAL LOW
HCT: 26.8 — ABNORMAL LOW
HCT: 28.4 — ABNORMAL LOW
HCT: 29.2 — ABNORMAL LOW
HCT: 40.4
Hemoglobin: 10 — ABNORMAL LOW
Hemoglobin: 10.1 — ABNORMAL LOW
Hemoglobin: 14.2
Hemoglobin: 8.5 — ABNORMAL LOW
Hemoglobin: 8.5 — ABNORMAL LOW
Hemoglobin: 9 — ABNORMAL LOW
Hemoglobin: 9.2 — ABNORMAL LOW
Hemoglobin: 9.3 — ABNORMAL LOW
MCHC: 34.5
MCHC: 34.7
MCHC: 34.7
MCHC: 34.9
MCHC: 34.9
MCHC: 35
MCHC: 35.1
MCHC: 35.2
MCV: 91.8
MCV: 91.8
MCV: 92.3
MCV: 93.1
MCV: 93.3
MCV: 93.4
MCV: 93.7
MCV: 95
Platelets: 128 — ABNORMAL LOW
Platelets: 131 — ABNORMAL LOW
Platelets: 159
Platelets: 193
Platelets: 80 — ABNORMAL LOW
Platelets: 96 — ABNORMAL LOW
Platelets: 98 — ABNORMAL LOW
Platelets: 99 — ABNORMAL LOW
RBC: 2.61 — ABNORMAL LOW
RBC: 2.63 — ABNORMAL LOW
RBC: 2.78 — ABNORMAL LOW
RBC: 2.83 — ABNORMAL LOW
RBC: 2.85 — ABNORMAL LOW
RBC: 3.05 — ABNORMAL LOW
RBC: 3.14 — ABNORMAL LOW
RBC: 4.4
RDW: 12.2
RDW: 12.2
RDW: 12.4
RDW: 12.5
RDW: 12.7
RDW: 12.8
RDW: 12.8
RDW: 12.9
WBC: 11.2 — ABNORMAL HIGH
WBC: 11.5 — ABNORMAL HIGH
WBC: 12.7 — ABNORMAL HIGH
WBC: 5.4
WBC: 5.8
WBC: 6.4
WBC: 7.4
WBC: 7.4

## 2011-01-05 LAB — TYPE AND SCREEN
ABO/RH(D): O POS
Antibody Screen: NEGATIVE

## 2011-01-05 LAB — MAGNESIUM
Magnesium: 2.3
Magnesium: 2.4
Magnesium: 2.5

## 2011-01-05 LAB — URINALYSIS, ROUTINE W REFLEX MICROSCOPIC
Bilirubin Urine: NEGATIVE
Glucose, UA: NEGATIVE
Hgb urine dipstick: NEGATIVE
Ketones, ur: NEGATIVE
Leukocytes, UA: NEGATIVE
Nitrite: NEGATIVE
Protein, ur: NEGATIVE
Specific Gravity, Urine: 1.018 (ref 1.005–1.035)
Urobilinogen, UA: 0.2
pH: 6.5

## 2011-01-05 LAB — PROTIME-INR
INR: 1
INR: 1.1
INR: 1.1
INR: 1.3
INR: 1.3
INR: 1.4
INR: 1.8 — ABNORMAL HIGH
INR: 2.2 — ABNORMAL HIGH
Prothrombin Time: 13.2
Prothrombin Time: 14.7
Prothrombin Time: 14.9
Prothrombin Time: 16.2 — ABNORMAL HIGH
Prothrombin Time: 16.5 — ABNORMAL HIGH
Prothrombin Time: 17.6 — ABNORMAL HIGH
Prothrombin Time: 21.3 — ABNORMAL HIGH
Prothrombin Time: 26 — ABNORMAL HIGH

## 2011-01-05 LAB — HEMOGLOBIN AND HEMATOCRIT, BLOOD
HCT: 28.8 — ABNORMAL LOW
Hemoglobin: 10.1 — ABNORMAL LOW

## 2011-01-05 LAB — HEMOGLOBIN A1C: Hgb A1c MFr Bld: 4.9

## 2011-01-05 LAB — FIBRINOGEN: Fibrinogen: 80 — CL

## 2011-01-05 LAB — APTT
aPTT: 27
aPTT: 37
aPTT: 51 — ABNORMAL HIGH

## 2011-01-05 LAB — PLATELET COUNT: Platelets: 139 — ABNORMAL LOW

## 2011-01-05 LAB — ABO/RH: ABO/RH(D): O POS

## 2012-12-24 ENCOUNTER — Ambulatory Visit (INDEPENDENT_AMBULATORY_CARE_PROVIDER_SITE_OTHER): Payer: BC Managed Care – PPO | Admitting: Pharmacist

## 2012-12-24 DIAGNOSIS — I359 Nonrheumatic aortic valve disorder, unspecified: Secondary | ICD-10-CM

## 2012-12-24 DIAGNOSIS — Z954 Presence of other heart-valve replacement: Secondary | ICD-10-CM

## 2013-01-19 ENCOUNTER — Encounter: Payer: Self-pay | Admitting: Cardiology

## 2013-01-19 ENCOUNTER — Encounter: Payer: Self-pay | Admitting: *Deleted

## 2013-01-19 DIAGNOSIS — E785 Hyperlipidemia, unspecified: Secondary | ICD-10-CM | POA: Insufficient documentation

## 2013-01-19 DIAGNOSIS — E7800 Pure hypercholesterolemia, unspecified: Secondary | ICD-10-CM | POA: Insufficient documentation

## 2013-01-19 DIAGNOSIS — J309 Allergic rhinitis, unspecified: Secondary | ICD-10-CM | POA: Insufficient documentation

## 2013-01-19 DIAGNOSIS — I719 Aortic aneurysm of unspecified site, without rupture: Secondary | ICD-10-CM | POA: Insufficient documentation

## 2013-01-21 ENCOUNTER — Encounter: Payer: Self-pay | Admitting: Cardiology

## 2013-01-21 ENCOUNTER — Ambulatory Visit (INDEPENDENT_AMBULATORY_CARE_PROVIDER_SITE_OTHER): Payer: BC Managed Care – PPO | Admitting: Cardiology

## 2013-01-21 VITALS — BP 140/80 | HR 61 | Ht 67.0 in | Wt 150.0 lb

## 2013-01-21 DIAGNOSIS — I359 Nonrheumatic aortic valve disorder, unspecified: Secondary | ICD-10-CM

## 2013-01-21 DIAGNOSIS — I10 Essential (primary) hypertension: Secondary | ICD-10-CM | POA: Insufficient documentation

## 2013-01-21 DIAGNOSIS — E7800 Pure hypercholesterolemia, unspecified: Secondary | ICD-10-CM

## 2013-01-21 DIAGNOSIS — I719 Aortic aneurysm of unspecified site, without rupture: Secondary | ICD-10-CM

## 2013-01-21 MED ORDER — METOPROLOL TARTRATE 25 MG PO TABS: 25.0000 mg | ORAL_TABLET | Freq: Two times a day (BID) | ORAL | Status: DC

## 2013-01-21 NOTE — Patient Instructions (Signed)
Your Rx for Metoprolol 25 MG BID has been sent into your pharmacy for a 90 day supply with 3 refills  Your physician wants you to follow-up in: 1 year with Dr Sherlyn Lick will receive a reminder letter in the mail two months in advance. If you don't receive a letter, please call our office to schedule the follow-up appointment.

## 2013-01-21 NOTE — Progress Notes (Signed)
  [address] [place], [place]  [address] Phone: [phone] Fax:  [phone]  Date:  01/21/2013   ID:  Steven Bowers, DOB 05-10-1955, MRN [id number]  PCP:  No primary provider on file.  Cardiologist:  Armanda Magic, MD     History of Present Illness: Steven Bowers is a 57 y.o. male with a history of severe AI and aortic root aneurysm s/p resection of anuerysm and AVR with mechanical valve on systemic anticoagulation and HTN who presents today for followup.  He is doing well.  He denies any chest pain, SOB, DOE dizziness, palpitations or syncope.  He has chronic RLE edema.   Wt Readings from Last 3 Encounters:  01/21/13 150 lb (68.04 kg)     Past Medical History  Diagnosis Date  . Hypercholesteremia   . Benign hypertensive heart disease without heart failure   . Thoracic aneurysm without mention of rupture   . Aortic valve disorders     severe aortic insufficiency status post mechanical aortic valve replacement, followed by Dr. Armanda Magic, cardiology, 2008  . Allergic rhinitis   . Hyperlipoproteinemia   . Aortic root aneurysm     status post resection with aortic conduit and replacement, followed by Dr. Armanda Magic, cardiology  . Hypertension     Current Outpatient Prescriptions  Medication Sig Dispense Refill  . aspirin EC 81 MG tablet Take 81 mg by mouth daily.      . fish oil-omega-3 fatty acids 1000 MG capsule Take 1 g by mouth daily.      . metoprolol tartrate (LOPRESSOR) 25 MG tablet 1 tab twice a day      . Multiple Vitamins-Minerals (CENTRUM SPECIALIST HEART PO) Take by mouth. 1 tab daily      . pravastatin (PRAVACHOL) 40 MG tablet 1 tab before bedtime      . warfarin (COUMADIN) 10 MG tablet As directed       No current facility-administered medications for this visit.    Allergies:    Allergies  Allergen Reactions  . Codimal-A [Brompheniramine]   . Latex   . Penicillins   . Sulfa Antibiotics     Social History:  The patient  reports  that he has never smoked. He does not have any smokeless tobacco history on file. He reports that he does not drink alcohol or use illicit drugs.   Family History:  The patient's family history includes Heart disease in his father; Hyperlipidemia in his mother; Hypertension in his mother.   ROS:  Please see the history of present illness.      All other systems reviewed and negative.   PHYSICAL EXAM: VS:  BP 140/80  Pulse 61  Ht 5\' 7"  (1.702 m)  Wt 150 lb (68.04 kg)  BMI 23.49 kg/m2 Well nourished, well developed, in no acute distress HEENT: normal Neck: no JVD Cardiac:  normal S1, S2; RRR; no murmur Lungs:  clear to auscultation bilaterally, no wheezing, rhonchi or rales Abd: soft, nontender, no hepatomegaly Ext: trace RLE edema Skin: warm and dry Neuro:  CNs 2-12 intact, no focal abnormalities noted  EKG:  NSR with no ST changes     ASSESSMENT AND PLAN:  1. Severe AR s/p mechanical AVR  - continue warfarin 2. Aortic root aneurysm s/p replacement 3. HTN - controlled  - continue metoprolol  Followup with me in 1 year  Signed, Armanda Magic, MD 01/21/2013 3:35 PM

## 2013-02-04 ENCOUNTER — Ambulatory Visit (INDEPENDENT_AMBULATORY_CARE_PROVIDER_SITE_OTHER): Payer: BC Managed Care – PPO | Admitting: Pharmacist

## 2013-02-04 DIAGNOSIS — I359 Nonrheumatic aortic valve disorder, unspecified: Secondary | ICD-10-CM

## 2013-02-04 DIAGNOSIS — Z954 Presence of other heart-valve replacement: Secondary | ICD-10-CM

## 2013-03-11 ENCOUNTER — Ambulatory Visit (INDEPENDENT_AMBULATORY_CARE_PROVIDER_SITE_OTHER): Payer: BC Managed Care – PPO | Admitting: *Deleted

## 2013-03-11 DIAGNOSIS — Z954 Presence of other heart-valve replacement: Secondary | ICD-10-CM

## 2013-03-11 DIAGNOSIS — I359 Nonrheumatic aortic valve disorder, unspecified: Secondary | ICD-10-CM

## 2013-04-03 ENCOUNTER — Other Ambulatory Visit: Payer: Self-pay | Admitting: Cardiology

## 2013-04-22 ENCOUNTER — Ambulatory Visit (INDEPENDENT_AMBULATORY_CARE_PROVIDER_SITE_OTHER): Payer: BC Managed Care – PPO | Admitting: Pharmacist

## 2013-04-22 DIAGNOSIS — I359 Nonrheumatic aortic valve disorder, unspecified: Secondary | ICD-10-CM

## 2013-04-22 DIAGNOSIS — Z5181 Encounter for therapeutic drug level monitoring: Secondary | ICD-10-CM | POA: Insufficient documentation

## 2013-04-22 DIAGNOSIS — Z954 Presence of other heart-valve replacement: Secondary | ICD-10-CM

## 2013-04-22 LAB — POCT INR: INR: 2.2

## 2013-06-03 ENCOUNTER — Ambulatory Visit (INDEPENDENT_AMBULATORY_CARE_PROVIDER_SITE_OTHER): Payer: BC Managed Care – PPO | Admitting: Pharmacist

## 2013-06-03 DIAGNOSIS — I359 Nonrheumatic aortic valve disorder, unspecified: Secondary | ICD-10-CM

## 2013-06-03 DIAGNOSIS — Z5181 Encounter for therapeutic drug level monitoring: Secondary | ICD-10-CM

## 2013-06-03 DIAGNOSIS — Z954 Presence of other heart-valve replacement: Secondary | ICD-10-CM

## 2013-06-03 LAB — POCT INR: INR: 2.9

## 2013-07-15 ENCOUNTER — Ambulatory Visit (INDEPENDENT_AMBULATORY_CARE_PROVIDER_SITE_OTHER): Payer: BC Managed Care – PPO | Admitting: *Deleted

## 2013-07-15 DIAGNOSIS — I359 Nonrheumatic aortic valve disorder, unspecified: Secondary | ICD-10-CM

## 2013-07-15 DIAGNOSIS — Z954 Presence of other heart-valve replacement: Secondary | ICD-10-CM

## 2013-07-15 DIAGNOSIS — Z5181 Encounter for therapeutic drug level monitoring: Secondary | ICD-10-CM

## 2013-07-15 LAB — POCT INR: INR: 3.1

## 2013-08-20 ENCOUNTER — Other Ambulatory Visit: Payer: Self-pay | Admitting: *Deleted

## 2013-08-20 MED ORDER — WARFARIN SODIUM 10 MG PO TABS: ORAL_TABLET | ORAL | Status: DC

## 2013-08-26 ENCOUNTER — Ambulatory Visit (INDEPENDENT_AMBULATORY_CARE_PROVIDER_SITE_OTHER): Payer: BC Managed Care – PPO

## 2013-08-26 DIAGNOSIS — Z954 Presence of other heart-valve replacement: Secondary | ICD-10-CM

## 2013-08-26 DIAGNOSIS — Z5181 Encounter for therapeutic drug level monitoring: Secondary | ICD-10-CM

## 2013-08-26 DIAGNOSIS — I359 Nonrheumatic aortic valve disorder, unspecified: Secondary | ICD-10-CM

## 2013-08-26 LAB — POCT INR: INR: 2.9

## 2013-10-11 ENCOUNTER — Ambulatory Visit (INDEPENDENT_AMBULATORY_CARE_PROVIDER_SITE_OTHER): Payer: BC Managed Care – PPO

## 2013-10-11 DIAGNOSIS — Z954 Presence of other heart-valve replacement: Secondary | ICD-10-CM

## 2013-10-11 DIAGNOSIS — I359 Nonrheumatic aortic valve disorder, unspecified: Secondary | ICD-10-CM

## 2013-10-11 DIAGNOSIS — Z5181 Encounter for therapeutic drug level monitoring: Secondary | ICD-10-CM

## 2013-10-11 LAB — POCT INR: INR: 2.4

## 2013-11-26 ENCOUNTER — Ambulatory Visit (INDEPENDENT_AMBULATORY_CARE_PROVIDER_SITE_OTHER): Payer: BC Managed Care – PPO | Admitting: *Deleted

## 2013-11-26 DIAGNOSIS — Z5181 Encounter for therapeutic drug level monitoring: Secondary | ICD-10-CM

## 2013-11-26 DIAGNOSIS — I359 Nonrheumatic aortic valve disorder, unspecified: Secondary | ICD-10-CM

## 2013-11-26 DIAGNOSIS — Z954 Presence of other heart-valve replacement: Secondary | ICD-10-CM

## 2013-11-26 LAB — POCT INR: INR: 3.2

## 2013-12-24 ENCOUNTER — Ambulatory Visit (INDEPENDENT_AMBULATORY_CARE_PROVIDER_SITE_OTHER): Payer: BC Managed Care – PPO | Admitting: Pharmacist

## 2013-12-24 DIAGNOSIS — Z5181 Encounter for therapeutic drug level monitoring: Secondary | ICD-10-CM

## 2013-12-24 DIAGNOSIS — I359 Nonrheumatic aortic valve disorder, unspecified: Secondary | ICD-10-CM

## 2013-12-24 LAB — POCT INR: INR: 3.3

## 2013-12-28 ENCOUNTER — Other Ambulatory Visit: Payer: Self-pay | Admitting: Cardiology

## 2014-01-20 ENCOUNTER — Ambulatory Visit (INDEPENDENT_AMBULATORY_CARE_PROVIDER_SITE_OTHER): Payer: BC Managed Care – PPO

## 2014-01-20 ENCOUNTER — Encounter: Payer: Self-pay | Admitting: Cardiology

## 2014-01-20 ENCOUNTER — Ambulatory Visit (INDEPENDENT_AMBULATORY_CARE_PROVIDER_SITE_OTHER): Payer: BC Managed Care – PPO | Admitting: Cardiology

## 2014-01-20 VITALS — BP 144/88 | HR 49 | Ht 67.0 in | Wt 154.8 lb

## 2014-01-20 DIAGNOSIS — Z5181 Encounter for therapeutic drug level monitoring: Secondary | ICD-10-CM

## 2014-01-20 DIAGNOSIS — E7800 Pure hypercholesterolemia, unspecified: Secondary | ICD-10-CM

## 2014-01-20 DIAGNOSIS — I7121 Aneurysm of the ascending aorta, without rupture: Secondary | ICD-10-CM

## 2014-01-20 DIAGNOSIS — I10 Essential (primary) hypertension: Secondary | ICD-10-CM

## 2014-01-20 DIAGNOSIS — I719 Aortic aneurysm of unspecified site, without rupture: Secondary | ICD-10-CM

## 2014-01-20 DIAGNOSIS — I359 Nonrheumatic aortic valve disorder, unspecified: Secondary | ICD-10-CM

## 2014-01-20 LAB — POCT INR: INR: 2

## 2014-01-20 NOTE — Progress Notes (Signed)
[address] [place], [place]  [address] Phone: [phone] Fax:  [phone]) [phone]  Date:  01/20/2014   ID:  Steven LambLarry B Bowers, DOB 08-Oct-1955, MRN [id number]  PCP:  Steven PatellaEADE,ROBERT ALEXANDER, MD  Cardiologist:  Steven Magicraci Elden Brucato, MD    History of Present Illness: This is a 58yo WM with a history of dyslipidemia, severe AI s/p mechanical AVR, aortic root aneursym s/p aortic conduit and replacement and HTN who presents today for followup.  He is doing well.  He denies any chest pain, SOB, DOE, LE edema, dizziness or palpitations.   Wt Readings from Last 3 Encounters:  01/20/14 154 lb 12.8 oz (70.217 kg)  01/21/13 150 lb (68.04 kg)     Past Medical History  Diagnosis Date  . Hypercholesteremia   . Benign hypertensive heart disease without heart failure   . Thoracic aneurysm without mention of rupture   . Aortic valve disorders     severe aortic insufficiency status post mechanical aortic valve replacement, followed by Dr. Armanda Magicraci Jenaro Bowers, cardiology, 2008  . Allergic rhinitis   . Hyperlipoproteinemia   . Aortic root aneurysm     status post resection with aortic conduit and replacement, followed by Dr. Armanda Magicraci Masiah Bowers, cardiology  . Hypertension     Current Outpatient Prescriptions  Medication Sig Dispense Refill  . aspirin EC 81 MG tablet Take 81 mg by mouth daily.    . Biotin 5 MG CAPS Take 5 mg by mouth daily.    . fish oil-omega-3 fatty acids 1000 MG capsule Take 1 g by mouth daily.    . metoprolol tartrate (LOPRESSOR) 25 MG tablet Take 1 tablet (25 mg total) by mouth 2 (two) times daily. 1 tab twice a day 180 tablet 3  . Multiple Vitamins-Minerals (CENTRUM SPECIALIST HEART PO) Take by mouth. 1 tab daily    . pravastatin (PRAVACHOL) 40 MG tablet 1 tab before bedtime    . warfarin (COUMADIN) 10 MG tablet Take as directed by anticoagulation clinic 30 tablet 3   No current facility-administered medications for this visit.    Allergies:    Allergies  Allergen Reactions  .  Codimal-A [Brompheniramine]   . Latex   . Penicillins   . Sulfa Antibiotics     Social History:  The patient  reports that he has never smoked. He does not have any smokeless tobacco history on file. He reports that he does not drink alcohol or use illicit drugs.   Family History:  The patient's family history includes Heart disease in his father; Hyperlipidemia in his mother; Hypertension in his mother.   ROS:  Please see the history of present illness.      All other systems reviewed and negative.   PHYSICAL EXAM: VS:  BP 144/88 mmHg  Pulse 49  Ht 5\' 7"  (1.702 m)  Wt 154 lb 12.8 oz (70.217 kg)  BMI 24.24 kg/m2 Well nourished, well developed, in no acute distress HEENT: normal Neck: no JVD Cardiac:  normal S1, S2; RRR; no murmurcrisp mechanical heart sounds Lungs:  clear to auscultation bilaterally, no wheezing, rhonchi or rales Abd: soft, nontender, no hepatomegaly Ext: no edema Skin: warm and dry Neuro:  CNs 2-12 intact, no focal abnormalities noted  EKG:  Sinus bradycardia at 49bpm with no ST changes     ASSESSMENT AND PLAN:  1. Severe AI s/p mechanical AVR - continue warfarin and ASA - 2D echo to followup on AVR 2. Aortic aneursym s/p aortic conduit and replacement 3. HTN well  controlled - continue metoprolol 4. Dyslipidemia - followed by PCP  Followup with me in 1 year  Signed, Steven Magicraci Sujay Grundman, MD [facility] 01/20/2014 3:35 PM

## 2014-01-20 NOTE — Patient Instructions (Signed)
Your physician has requested that you have an echocardiogram. Echocardiography is a painless test that uses sound waves to create images of your heart. It provides your doctor with information about the size and shape of your heart and how well your heart's chambers and valves are working. This procedure takes approximately one hour. There are no restrictions for this procedure.  Your physician recommends that you continue on your current medications as directed. Please refer to the Current Medication list given to you today.  Your physician wants you to follow-up in: 1 year with Dr. Turner. You will receive a reminder letter in the mail two months in advance. If you don't receive a letter, please call our office to schedule the follow-up appointment.  

## 2014-01-28 ENCOUNTER — Other Ambulatory Visit (HOSPITAL_COMMUNITY): Payer: BC Managed Care – PPO

## 2014-01-29 ENCOUNTER — Other Ambulatory Visit (HOSPITAL_COMMUNITY): Payer: BC Managed Care – PPO

## 2014-02-17 ENCOUNTER — Ambulatory Visit (INDEPENDENT_AMBULATORY_CARE_PROVIDER_SITE_OTHER): Payer: BC Managed Care – PPO | Admitting: Pharmacist

## 2014-02-17 DIAGNOSIS — Z5181 Encounter for therapeutic drug level monitoring: Secondary | ICD-10-CM

## 2014-02-17 DIAGNOSIS — I359 Nonrheumatic aortic valve disorder, unspecified: Secondary | ICD-10-CM

## 2014-02-17 LAB — POCT INR: INR: 3.6

## 2014-03-10 ENCOUNTER — Ambulatory Visit (INDEPENDENT_AMBULATORY_CARE_PROVIDER_SITE_OTHER): Payer: BC Managed Care – PPO | Admitting: Surgery

## 2014-03-10 DIAGNOSIS — Z5181 Encounter for therapeutic drug level monitoring: Secondary | ICD-10-CM

## 2014-03-10 DIAGNOSIS — I359 Nonrheumatic aortic valve disorder, unspecified: Secondary | ICD-10-CM

## 2014-03-10 LAB — POCT INR
INR: 3.4
INR: 3.4

## 2014-03-26 ENCOUNTER — Other Ambulatory Visit: Payer: Self-pay

## 2014-03-26 MED ORDER — METOPROLOL TARTRATE 25 MG PO TABS: 25.0000 mg | ORAL_TABLET | Freq: Two times a day (BID) | ORAL | Status: DC

## 2014-05-31 ENCOUNTER — Other Ambulatory Visit: Payer: Self-pay | Admitting: Cardiology

## 2014-06-05 ENCOUNTER — Other Ambulatory Visit: Payer: Self-pay | Admitting: Cardiology

## 2014-06-05 ENCOUNTER — Telehealth: Payer: Self-pay | Admitting: *Deleted

## 2014-06-05 NOTE — Telephone Encounter (Signed)
Patient has a coumadin refill request in the system.  Left a message for patient to return a call in reference to Coumadin.  According to the Anticoagulation track and documentation, patient's coumadin is [facility] as of 03/10/14. Call back number and message left on answering machine.

## 2014-10-29 ENCOUNTER — Encounter: Payer: Self-pay | Admitting: Cardiology

## 2014-12-08 ENCOUNTER — Encounter: Payer: Self-pay | Admitting: Cardiology

## 2014-12-25 ENCOUNTER — Encounter: Payer: Self-pay | Admitting: Cardiology

## 2015-02-13 ENCOUNTER — Ambulatory Visit: Payer: BC Managed Care – PPO | Admitting: Cardiology

## 2015-04-01 ENCOUNTER — Ambulatory Visit: Payer: BC Managed Care – PPO | Admitting: Cardiology

## 2015-04-01 ENCOUNTER — Encounter: Payer: Self-pay | Admitting: Cardiology

## 2015-04-01 ENCOUNTER — Ambulatory Visit (INDEPENDENT_AMBULATORY_CARE_PROVIDER_SITE_OTHER): Payer: BC Managed Care – PPO | Admitting: Cardiology

## 2015-04-01 VITALS — BP 140/70 | HR 64 | Ht 67.0 in | Wt 162.6 lb

## 2015-04-01 DIAGNOSIS — I359 Nonrheumatic aortic valve disorder, unspecified: Secondary | ICD-10-CM

## 2015-04-01 DIAGNOSIS — E7800 Pure hypercholesterolemia, unspecified: Secondary | ICD-10-CM | POA: Diagnosis not present

## 2015-04-01 MED ORDER — METOPROLOL TARTRATE 25 MG PO TABS: 25.0000 mg | ORAL_TABLET | Freq: Two times a day (BID) | ORAL | Status: DC

## 2015-04-01 NOTE — Progress Notes (Signed)
Cardiology Office Note   Date:  04/01/2015   ID:  Steven Bowers, DOB [birth year], MRN [id number]  PCP:  Lolita Patella, MD    No chief complaint on file.     History of Present Illness: This is a 60yo WM with a history of dyslipidemia, severe AI s/p mechanical AVR, aortic root aneursym s/p aortic conduit and replacement and HTN who presents today for followup. He is doing well. He denies any chest pain, SOB, DOE, LE edema, dizziness or palpitations.  He denies any claudication symptoms in his legs.        Past Medical History  Diagnosis Date  . Hypercholesteremia   . Benign hypertensive heart disease without heart failure   . Thoracic aneurysm without mention of rupture   . Aortic valve disorders     severe aortic insufficiency status post mechanical aortic valve replacement, followed by Dr. Armanda Magic, cardiology, 2008  . Allergic rhinitis   . Hyperlipoproteinemia   . Aortic root aneurysm (HCC)     status post resection with aortic conduit and replacement, followed by Dr. Armanda Magic, cardiology  . Hypertension     Past Surgical History  Procedure Laterality Date  . Cardiac catheterization    . Aortic valve replacement  2008    mechanical  . Aortic root aneurysm resection       Current Outpatient Prescriptions  Medication Sig Dispense Refill  . aspirin EC 81 MG tablet Take 81 mg by mouth daily.    . Biotin 5 MG CAPS Take 5 mg by mouth every other day.     . fish oil-omega-3 fatty acids 1000 MG capsule Take 1 g by mouth daily.    . metoprolol tartrate (LOPRESSOR) 25 MG tablet Take 25 mg by mouth 2 (two) times daily.    . Multiple Vitamins-Minerals (CENTRUM SPECIALIST HEART PO) Take 1 tablet by mouth every other day.     . pravastatin (PRAVACHOL) 40 MG tablet Take 40 mg by mouth daily.     Marland Kitchen warfarin (COUMADIN) 10 MG tablet Take 10 mg by mouth every other day. PATIENT TAKES 10 MG TABLET ON Tuesday, Thursday, AND Saturday AND THEN  5 MG TABLET ON Monday, Wednesday,FRIDAY, SUNDAY     No current facility-administered medications for this visit.    Allergies:   Codimal-a; Latex; Penicillins; and Sulfa antibiotics    Social History:  The patient  reports that he has never smoked. He does not have any smokeless tobacco history on file. He reports that he does not drink alcohol or use illicit drugs.   Family History:  The patient's family history includes Heart disease in his father; Hyperlipidemia in his mother; Hypertension in his mother.    ROS:  Please see the history of present illness.   Otherwise, review of systems are positive for none.   All other systems are reviewed and negative.    PHYSICAL EXAM: VS:  BP 140/70 mmHg  Pulse 64  Ht 5\' 7"  (1.702 m)  Wt 162 lb 9.6 oz (73.755 kg)  BMI 25.46 kg/m2 , BMI Body mass index is 25.46 kg/(m^2). GEN: Well nourished, well developed, in no acute distress HEENT: normal Neck: no JVD, carotid bruits, or masses Cardiac: RRR; no murmurs, rubs, or gallops,no edema  Respiratory:  clear to auscultation bilaterally, normal work of breathing GI: soft, nontender, nondistended, + BS MS: no deformity or atrophy Skin: warm and dry,  no rash Neuro:  Strength and sensation are intact Psych: euthymic mood, full affect   EKG:  EKG is ordered today. The ekg ordered today demonstrates sinus bradycardia at 57bpm with no ST changes   Recent Labs: No results found for requested labs within last 365 days.    Lipid Panel No results found for: CHOL, TRIG, HDL, CHOLHDL, VLDL, LDLCALC, LDLDIRECT    Wt Readings from Last 3 Encounters:  04/01/15 162 lb 9.6 oz (73.755 kg)  01/20/14 154 lb 12.8 oz (70.217 kg)  01/21/13 150 lb (68.04 kg)     ASSESSMENT AND PLAN:  1. Severe AI s/p mechanical AVR - continue warfarin and ASA - 2D echo to followup on AVR 2. Aortic aneursym s/p aortic conduit and replacement 3. HTN - BP controlled - at home it runs 128/5580mmHg.  He will continue  metoprolol 4. Dyslipidemia - followed by PCP - his last LDL in the fall was 112. I would like to see his LDL < 70.  He wants to try dietary changes and exercise and will recheck in 4 months   Current medicines are reviewed at length with the patient today.  The patient does not have concerns regarding medicines.  The following changes have been made:  no change  Labs/ tests ordered today: See above Assessment and Plan No orders of the defined types were placed in this encounter.     Disposition:   FU with me in 1 year  Signed, Quintella ReichertURNER,TRACI R, MD  04/01/2015 1:58 PM    [facility] [address], [place], [place]  [address] Phone: [phone]([phone]; Fax: [phone]

## 2015-04-01 NOTE — Patient Instructions (Signed)
Medication Instructions:  Your physician recommends that you continue on your current medications as directed. Please refer to the Current Medication list given to you today.   Labwork: Your physician recommends that you return for FASTING lab work in 4 months.   Testing/Procedures: Your physician has requested that you have an echocardiogram. Echocardiography is a painless test that uses sound waves to create images of your heart. It provides your doctor with information about the size and shape of your heart and how well your heart's chambers and valves are working. This procedure takes approximately one hour. There are no restrictions for this procedure.  Follow-Up: Your physician wants you to follow-up in: 1 year with Dr. Mayford Knifeurner. You will receive a reminder letter in the mail two months in advance. If you don't receive a letter, please call our office to schedule the follow-up appointment.   Any Other Special Instructions Will Be Listed Below (If Applicable).     If you need a refill on your cardiac medications before your next appointment, please call your pharmacy.

## 2015-08-03 ENCOUNTER — Other Ambulatory Visit: Payer: BC Managed Care – PPO

## 2015-09-11 ENCOUNTER — Other Ambulatory Visit (HOSPITAL_COMMUNITY): Payer: BC Managed Care – PPO

## 2015-09-18 ENCOUNTER — Other Ambulatory Visit: Payer: BC Managed Care – PPO

## 2015-09-18 ENCOUNTER — Other Ambulatory Visit (HOSPITAL_COMMUNITY): Payer: BC Managed Care – PPO

## 2015-12-01 ENCOUNTER — Telehealth: Payer: Self-pay

## 2015-12-01 NOTE — Telephone Encounter (Signed)
-----   Message from Elita Booneegina A Griffin sent at 11/19/2015  1:59 PM EDT ----- Regarding: Cancelled Echo Hi Katie!  Several attempts have been made to contact this patient(6/9,7/5,7/20,7/24,8/31) to reschedule the Echo Dr. Mayford Knifeurner ordered for her back in January. I just wanted to inform you that I will be removing her from the workqueue. Have a great day!  Rene Kocheregina

## 2016-03-07 ENCOUNTER — Telehealth: Payer: Self-pay | Admitting: Cardiology

## 2016-03-07 MED ORDER — METOPROLOL TARTRATE 25 MG PO TABS: 25.0000 mg | ORAL_TABLET | Freq: Two times a day (BID) | ORAL | 2 refills | Status: DC

## 2016-03-07 NOTE — Telephone Encounter (Signed)
Patient needs a refill on his Metoprolol 25mg  called to [facility] [address]  He is scheduled for his yearly f/u for 05/04/16

## 2016-04-29 ENCOUNTER — Encounter: Payer: Self-pay | Admitting: Cardiology

## 2016-05-04 ENCOUNTER — Encounter (INDEPENDENT_AMBULATORY_CARE_PROVIDER_SITE_OTHER): Payer: Self-pay

## 2016-05-04 ENCOUNTER — Ambulatory Visit (INDEPENDENT_AMBULATORY_CARE_PROVIDER_SITE_OTHER): Payer: BC Managed Care – PPO | Admitting: Cardiology

## 2016-05-04 ENCOUNTER — Encounter: Payer: Self-pay | Admitting: Cardiology

## 2016-05-04 ENCOUNTER — Telehealth: Payer: Self-pay | Admitting: Cardiology

## 2016-05-04 VITALS — BP 158/90 | HR 55 | Ht 67.0 in | Wt 161.1 lb

## 2016-05-04 DIAGNOSIS — I359 Nonrheumatic aortic valve disorder, unspecified: Secondary | ICD-10-CM

## 2016-05-04 DIAGNOSIS — I10 Essential (primary) hypertension: Secondary | ICD-10-CM | POA: Diagnosis not present

## 2016-05-04 DIAGNOSIS — I719 Aortic aneurysm of unspecified site, without rupture: Secondary | ICD-10-CM

## 2016-05-04 DIAGNOSIS — I7121 Aneurysm of the ascending aorta, without rupture: Secondary | ICD-10-CM

## 2016-05-04 NOTE — Progress Notes (Signed)
Cardiology Office Note    Date:  05/04/2016   ID:  Steven LambLarry B Bellissimo, DOB 09-Aug-1955, MRN [id number]  PCP:  Lolita PatellaEADE,ROBERT ALEXANDER, MD  Cardiologist:  Armanda Magicraci Jaevon Paras, MD   Chief Complaint  Patient presents with  . Follow-up    aortic insuff, aortic root aneurysm, HTN  . Aortic Insuffiency  . Hypertension    History of Present Illness:  Steven Bowers is a 61 y.o. 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 He is doing well. He denies any chest pain, SOB, DOE,  dizziness or palpitations.  He denies any claudication symptoms in his legs.  He has chronic LE edema that has been present for over 10 years and is stable.      Past Medical History:  Diagnosis Date  . Allergic rhinitis   . Aortic root aneurysm (HCC)    status post resection with aortic conduit and replacement, followed by Dr. Armanda Magicraci Zakiah Beckerman, cardiology  . Aortic valve disorders    severe aortic insufficiency status post mechanical aortic valve replacement, followed by Dr. Armanda Magicraci Darcey Demma, cardiology, 2008  . Benign hypertensive heart disease without heart failure   . Hypercholesteremia   . Hypertension     Past Surgical History:  Procedure Laterality Date  . aortic root aneurysm resection    . AORTIC VALVE REPLACEMENT  2008   mechanical  . CARDIAC CATHETERIZATION      Current Medications: Current Meds  Medication Sig  . aspirin EC 81 MG tablet Take 81 mg by mouth daily.  . Biotin 5 MG CAPS Take 5 mg by mouth every other day.   . fish oil-omega-3 fatty acids 1000 MG capsule Take 1 g by mouth daily.  . metoprolol tartrate (LOPRESSOR) 25 MG tablet Take 1 tablet (25 mg total) by mouth 2 (two) times daily.  . Multiple Vitamins-Minerals (CENTRUM SPECIALIST HEART PO) Take 1 tablet by mouth every other day.   . pravastatin (PRAVACHOL) 40 MG tablet Take 40 mg by mouth daily.   Marland Kitchen. warfarin (COUMADIN)  10 MG tablet Take 10 mg by mouth every other day. PATIENT TAKES 10 MG TABLET ON Tuesday, Thursday, AND Saturday AND THEN 5 MG TABLET ON Monday, Wednesday,FRIDAY, SUNDAY    Allergies:   Codimal-a [brompheniramine]; Latex; Penicillins; and Sulfa antibiotics   Social History   Social History  . Marital status: Married    Spouse name: N/A  . Number of children: N/A  . Years of education: N/A   Social History Main Topics  . Smoking status: Never Smoker  . Smokeless tobacco: Never Used  . Alcohol use No  . Drug use: No  . Sexual activity: Not Asked   Other Topics Concern  . None   Social History Narrative  . None     Family History:  The patient's family history includes Heart disease in his father; Hyperlipidemia in his mother; Hypertension in his mother.   ROS:   Please see the history of present illness.    ROS All other systems reviewed and are negative.  No flowsheet data found.     PHYSICAL EXAM:   VS:  BP (!) 158/90   Pulse (!) 55   Ht 5\' 7"  (1.702 m)   Wt 161 lb 1.9 oz (73.1 kg)   BMI 25.23 kg/m    GEN: Well nourished, well developed, in no acute distress  HEENT: normal  Neck: no  JVD, carotid bruits, or masses Cardiac: RRR; no murmurs, rubs, or gallops,no edema.  Intact distal pulses bilaterally.  Respiratory:  clear to auscultation bilaterally, normal work of breathing GI: soft, nontender, nondistended, + BS MS: no deformity or atrophy  Skin: warm and dry, no rash Neuro:  Alert and Oriented x 3, Strength and sensation are intact Psych: euthymic mood, full affect  Wt Readings from Last 3 Encounters:  05/04/16 161 lb 1.9 oz (73.1 kg)  04/01/15 162 lb 9.6 oz (73.8 kg)  01/20/14 154 lb 12.8 oz (70.2 kg)      Studies/Labs Reviewed:   EKG:  EKG is ordered today.  The ekg ordered today demonstrates NSR at 55bpm with no ST changes  Recent Labs: No results found for requested labs within last 8760 hours.   Lipid Panel No results found for: CHOL, TRIG,  HDL, CHOLHDL, VLDL, LDLCALC, LDLDIRECT  Additional studies/ records that were reviewed today include:  none    ASSESSMENT:    1. Aortic valve disorder   2. Essential hypertension   3. Aortic root aneurysm (HCC)      PLAN:  In order of problems listed above:  1. Severe AI s/p mechanical AVR - he has not had an echo done in over 5 years so I will repeat an echo.  Continue ASA and warfarin. 2. HTN - BP poorly controlled today.  He says that his BP is controlled at home.  I have asked him to check his BP daily for a week and call with results  3. Aortic root aneurysm s/p repair - needs aggressive treatment of HTN and hyperlipidemia.  Continue statin.  I will get a copy of last FLP and ALT from PCP.     Medication Adjustments/Labs and Tests Ordered: Current medicines are reviewed at length with the patient today.  Concerns regarding medicines are outlined above.  Medication changes, Labs and Tests ordered today are listed in the Patient Instructions below.  There are no Patient Instructions on file for this visit.   Signed, Armanda Magic, MD  05/04/2016 3:06 PM    [facility] [address] [place], [place], [place]  [address] Phone: [phone]; Fax: [phone]

## 2016-05-04 NOTE — Patient Instructions (Signed)

## 2016-05-04 NOTE — Telephone Encounter (Signed)
Patient has decided not to schedule the echo at this time.  He want to discuss with his insurance company before he schedule the test.  The last time he was told he would need to pay $1,400.  He is going to call back at a later time to schedule

## 2016-05-12 ENCOUNTER — Telehealth: Payer: Self-pay | Admitting: Cardiology

## 2016-05-12 NOTE — Telephone Encounter (Signed)
Informed patient his BP is stable and there are no changes being made.  He was grateful for call.

## 2016-05-12 NOTE — Telephone Encounter (Signed)
New Message  Pt call requesting to speak with RN to f/u on bp readings..  2/14 After appt 138/81 2/15 119/73 2/16 128/80 2/17 114/79 2/18 113/70 2/19 123/77 2/20 117/72 2/21 125/78 2/22 122/76  Please call back to discuss

## 2016-05-12 NOTE — Telephone Encounter (Signed)
BP controlled no new recs

## 2016-05-26 ENCOUNTER — Telehealth: Payer: Self-pay | Admitting: Cardiology

## 2016-05-26 NOTE — Telephone Encounter (Signed)
Follow Up   Returning phone call regarding patients labs. Stated that doctor has not reviewed pt's labs yet, and she can't send labs until he reviews. Once he reviews she will send them.

## 2016-06-02 ENCOUNTER — Telehealth: Payer: Self-pay | Admitting: Cardiology

## 2016-06-02 DIAGNOSIS — E7800 Pure hypercholesterolemia, unspecified: Secondary | ICD-10-CM

## 2016-06-02 MED ORDER — PRAVASTATIN SODIUM 80 MG PO TABS: 80.0000 mg | ORAL_TABLET | Freq: Every day | ORAL | 11 refills | Status: DC

## 2016-06-02 NOTE — Telephone Encounter (Signed)
-----   Message from Quintella Reichertraci R Turner, MD sent at 05/30/2016  1:29 PM EDT ----- Increase pravastatin to 80mg  daily and repeat FLP and ALT in 6 weeks.  LDL goal < 70

## 2016-06-02 NOTE — Telephone Encounter (Signed)
Left message to call back  

## 2016-06-02 NOTE — Telephone Encounter (Signed)
Follow up ° ° ° ° ° ° ° ° ° °Pt returning nurses call °

## 2016-06-02 NOTE — Telephone Encounter (Signed)
Informed patient of results and verbal understanding expressed.  Instructed patient to INCREASE PRAVASTATIN to 80 mg daily. Will mail Rx to patient to have labs drawn at PCP per his request. Patient agrees with treatment plan.

## 2016-06-02 NOTE — Telephone Encounter (Signed)
Mr.Harron is returning a call about his lab work that was done at his PCP office ..Marland Kitchen

## 2016-07-28 ENCOUNTER — Other Ambulatory Visit: Payer: Self-pay

## 2016-07-28 MED ORDER — METOPROLOL TARTRATE 25 MG PO TABS: 25.0000 mg | ORAL_TABLET | Freq: Two times a day (BID) | ORAL | 8 refills | Status: DC

## 2016-08-13 ENCOUNTER — Other Ambulatory Visit: Payer: Self-pay | Admitting: Cardiology

## 2016-08-30 ENCOUNTER — Telehealth: Payer: Self-pay | Admitting: Cardiology

## 2016-08-30 NOTE — Telephone Encounter (Signed)
Called patient and LVM to call and schedule his echo.

## 2017-05-08 ENCOUNTER — Other Ambulatory Visit: Payer: Self-pay | Admitting: Cardiology

## 2017-06-07 ENCOUNTER — Other Ambulatory Visit: Payer: Self-pay | Admitting: Cardiology

## 2019-05-18 ENCOUNTER — Ambulatory Visit: Payer: BC Managed Care – PPO | Attending: Internal Medicine

## 2019-05-18 DIAGNOSIS — Z23 Encounter for immunization: Secondary | ICD-10-CM | POA: Insufficient documentation

## 2019-05-18 NOTE — Progress Notes (Signed)
   Covid-19 Vaccination Clinic  Name:  Steven Bowers    MRN: [id number] DOB: 11-30-1955  05/18/2019  [name] was observed post Covid-19 immunization for 15 minutes without incidence. He was provided with Vaccine Information Sheet and instruction to access the V-Safe system.   [name] was instructed to call 911 with any severe reactions post vaccine: Marland Kitchen Difficulty breathing  . Swelling of your face and throat  . A fast heartbeat  . A bad rash all over your body  . Dizziness and weakness    Immunizations Administered    Name Date Dose VIS Date Route   Pfizer COVID-19 Vaccine 05/18/2019  2:49 PM 0.3 mL 03/01/2019 Intramuscular   Manufacturer: [employer], [employer]   Lot: [id number]   NDC: [address]-9

## 2019-06-08 ENCOUNTER — Ambulatory Visit: Payer: BC Managed Care – PPO | Attending: Internal Medicine

## 2019-06-08 DIAGNOSIS — Z23 Encounter for immunization: Secondary | ICD-10-CM

## 2019-06-08 NOTE — Progress Notes (Signed)
   Covid-19 Vaccination Clinic  Name:  Steven Bowers    MRN: [id number] DOB: Jul 26, 1955  06/08/2019  [name] was observed post Covid-19 immunization for 15 minutes without incident. He was provided with Vaccine Information Sheet and instruction to access the V-Safe system.   [name] was instructed to call 911 with any severe reactions post vaccine: Marland Kitchen Difficulty breathing  . Swelling of face and throat  . A fast heartbeat  . A bad rash all over body  . Dizziness and weakness   Immunizations Administered    Name Date Dose VIS Date Route   Pfizer COVID-19 Vaccine 06/08/2019  8:25 AM 0.3 mL 03/01/2019 Intramuscular   Manufacturer: [employer], [employer]   Lot: [id number]   NDC: [address]-6

## 2023-03-24 ENCOUNTER — Ambulatory Visit
Admission: EM | Admit: 2023-03-24 | Discharge: 2023-03-24 | Disposition: A | Discharge status: Home / Self Care | Payer: 59 | Attending: Physician Assistant | Admitting: Physician Assistant

## 2023-03-24 DIAGNOSIS — S0990XA Unspecified injury of head, initial encounter: Secondary | ICD-10-CM

## 2023-03-24 DIAGNOSIS — S0001XA Abrasion of scalp, initial encounter: Secondary | ICD-10-CM | POA: Diagnosis not present

## 2023-03-24 MED ORDER — TETANUS-DIPHTH-ACELL PERTUSSIS 5-2.5-18.5 LF-MCG/0.5 IM SUSY
0.5000 mL | PREFILLED_SYRINGE | Freq: Once | INTRAMUSCULAR | Status: AC
Start: 2023-03-24 — End: 2023-03-24
  Administered 2023-03-24: 0.5 mL via INTRAMUSCULAR

## 2023-03-24 MED ORDER — LIDOCAINE-EPINEPHRINE-TETRACAINE (LET) TOPICAL GEL
3.0000 mL | Freq: Once | TOPICAL | Status: AC
  Administered 2023-03-24: 3 mL via TOPICAL

## 2023-03-24 NOTE — ED Triage Notes (Signed)
 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 No fall down ladder. No loc. DOI: 03-22-2022 around the time 24 hrs ago. Last Tdap unknown.

## 2023-03-24 NOTE — ED Provider Notes (Signed)
 [facility]    CSN: [id number] Arrival date & time: 03/24/23  9187      History   Chief Complaint Chief Complaint  Patient presents with   Head Injury    HPI Steven Bowers is a 68 y.o. male.   Patient here today for evaluation of injury to his scalp that occurred around 24 hours ago.  She reports she was up on a ladder and accidentally slipped scraping his head on the frame.  He states that he did not have any LOC and actually worked the rest of the day.  He denies any headache, nausea, vomiting.  He has not any vision changes.  He is on Coumadin  and states he did have bleeding from area for a while.  He cannot recall his last tetanus vaccination  The history is provided by the patient.  Head Injury Associated symptoms: no headaches, no nausea, no numbness and no vomiting     Past Medical History:  Diagnosis Date   Allergic rhinitis    Aortic root aneurysm    status post resection with aortic conduit and replacement, followed by Dr. Wilbert [name], cardiology   Aortic valve disorders    severe aortic insufficiency status post mechanical aortic valve replacement, followed by Dr. Wilbert [name], cardiology, 2008   Benign hypertensive heart disease without heart failure    Hypercholesteremia    Hypertension     Patient Active Problem List   Diagnosis Date Noted   Encounter for therapeutic drug monitoring 04/22/2013   Hypertension    Hypercholesteremia    Allergic rhinitis    Hyperlipoproteinemia    Aortic root aneurysm    Aortic valve disorder 12/24/2012   Heart valve replaced by other means 12/24/2012    Past Surgical History:  Procedure Laterality Date   aortic root aneurysm resection     AORTIC VALVE REPLACEMENT  2008   mechanical   CARDIAC CATHETERIZATION         Home Medications    Prior to Admission medications   Medication Sig Start Date End Date Taking? Authorizing Provider  aspirin EC 81 MG tablet Take 81 mg by mouth daily.   Yes  [provider]  Biotin 5 MG CAPS Take 5 mg by mouth every other day.    Yes [provider]  fish oil-omega-3 fatty acids 1000 MG capsule Take 1 g by mouth daily.   Yes [provider]  metoprolol  tartrate (LOPRESSOR ) 25 MG tablet TAKE 1 TABLET BY MOUTH TWICE A DAY 05/08/17  Yes Turner, [name], MD  Multiple Vitamins-Minerals (CENTRUM SPECIALIST HEART PO) Take 1 tablet by mouth every other day.    Yes [provider]  pravastatin  (PRAVACHOL ) 80 MG tablet Take 1 tablet (80 mg total) by mouth daily. Please make overdue yearly appt with Dr. Harvest before anymore refills. 2nd attempt Patient taking differently: Take 40 mg by mouth daily. Please make overdue yearly appt with Dr. Harvest before anymore refills. 2nd attempt 06/08/17  Yes Turner, [name], MD  warfarin (COUMADIN ) 10 MG tablet Take 10 mg by mouth every other day. PATIENT TAKES 10 MG TABLET ON Tuesday, Thursday, AND Saturday AND THEN 5 MG TABLET ON Monday, Wednesday,FRIDAY, SUNDAY   Yes [provider]    Family History Family History  Problem Relation Age of Onset   Heart disease Father    Hypertension Mother    Hyperlipidemia Mother     Social History Social History   Tobacco Use   Smoking  status: Never    Passive exposure: Never   Smokeless tobacco: Never  Vaping Use   Vaping status: Never Used  Substance Use Topics   Alcohol use: No   Drug use: No     Allergies   Codimal-a [brompheniramine], Latex, Penicillins, and Sulfa antibiotics   Review of Systems Review of Systems  Constitutional:  Negative for chills and fever.  Eyes:  Negative for discharge, redness and visual disturbance.  Respiratory:  Negative for shortness of breath.   Gastrointestinal:  Negative for nausea and vomiting.  Skin:  Positive for wound. Negative for color change.  Neurological:  Negative for numbness and headaches.     Physical Exam Triage Vital Signs ED Triage Vitals  Encounter Vitals Group      BP 03/24/23 0820 (!) 172/76     Systolic BP Percentile --      Diastolic BP Percentile --      Pulse Rate 03/24/23 0820 65     Resp 03/24/23 0820 18     Temp 03/24/23 0820 97.9 F (36.6 C)     Temp Source 03/24/23 0820 Oral     SpO2 03/24/23 0820 99 %     Weight 03/24/23 0831 160 lb (72.6 kg)     Height 03/24/23 0831 5' 6 (1.676 m)     Head Circumference --      Peak Flow --      Pain Score 03/24/23 0828 0     Pain Loc --      Pain Education --      Exclude from Growth Chart --    No data found.  Updated Vital Signs BP (!) 170/80 (BP Location: Right Arm)   Pulse 65   Temp 97.9 F (36.6 C) (Oral)   Resp 18   Ht 5' 6 (1.676 m)   Wt 160 lb (72.6 kg)   SpO2 99%   BMI 25.82 kg/m   Visual Acuity Right Eye Distance:   Left Eye Distance:   Bilateral Distance:    Right Eye Near:   Left Eye Near:    Bilateral Near:     Physical Exam Vitals and nursing note reviewed.  Constitutional:      General: He is not in acute distress.    Appearance: Normal appearance. He is not ill-appearing.  HENT:     Head: Normocephalic and atraumatic.  Eyes:     Conjunctiva/sclera: Conjunctivae normal.  Cardiovascular:     Rate and Rhythm: Normal rate.  Pulmonary:     Effort: Pulmonary effort is normal. No respiratory distress.  Skin:    Comments: Approx 3 cm skin tear to upper scalp without active bleeding  Neurological:     Mental Status: He is alert.  Psychiatric:        Mood and Affect: Mood normal.        Behavior: Behavior normal.        Thought Content: Thought content normal.      UC Treatments / Results  Labs (all labs ordered are listed, but only abnormal results are displayed) Labs Reviewed - No data to display  EKG   Radiology No results found.  Procedures Procedures (including critical care time)  Medications Ordered in UC Medications  Tdap (BOOSTRIX ) injection 0.5 mL (has no administration in time range)  lidocaine -EPINEPHrine -tetracaine  (LET)  topical gel (3 mLs Topical Given 03/24/23 0840)    Initial Impression / Assessment and Plan / UC Course  I have reviewed the triage vital signs and the  nursing notes.  Pertinent labs & imaging results that were available during my care of the patient were reviewed by me and considered in my medical decision making (see chart for details).    Closure not indicated.  Will administer tetanus vaccination and advised patient keep wound dressed and avoid submerging in water for the next 48 hours.  Recommended follow-up with any further bleeding or further concerns.  Low suspicion of concussion or other intracranial injury given lack of headache, nausea, vomiting or other concerning symptoms 24 hours post injury  Final Clinical Impressions(s) / UC Diagnoses   Final diagnoses:  Injury of head, initial encounter  Abrasion, scalp w/o infection   Discharge Instructions   None    ED Prescriptions   None    PDMP not reviewed this encounter.   Billy Asberry FALCON, PA-C 03/26/23 1139

## 2023-06-06 ENCOUNTER — Telehealth: Payer: Self-pay | Admitting: *Deleted

## 2023-06-06 NOTE — Telephone Encounter (Signed)
 Patient has not been seen by cardiology since 2018.  We are not monitoring his warfarin/INR.

## 2023-06-06 NOTE — Telephone Encounter (Signed)
    Primary Cardiologist:None  Chart reviewed as part of pre-operative protocol coverage. Because of Steven Bowers's past medical history and time since last visit, he/she will require a follow-up visit in order to better assess preoperative cardiovascular risk.  Pre-op covering staff: - Please schedule in office appointment and call patient to inform them. - Please contact requesting surgeon's office via preferred method (i.e, phone, fax) to inform them of need for appointment prior to surgery.  If applicable, this message will also be routed to pharmacy pool and/or primary cardiologist for input on holding anticoagulant/antiplatelet agent as requested below so that this information is available at time of patient's appointment.   We do not manage patients Coumadin.  Recommendations for holding Coumadin will need to come from prescribing provider.  Ronney Asters, NP  06/06/2023, 2:51 PM

## 2023-06-06 NOTE — Telephone Encounter (Signed)
 Patient has been scheduled for in office visit for preop clearance

## 2023-06-06 NOTE — Telephone Encounter (Signed)
   Pre-operative Risk Assessment    Patient Name: Steven Bowers  DOB: [birth year] MRN: [id number]   Date of last office visit: 05/04/2016 Date of next office visit: None  Request for Surgical Clearance    Procedure:   Prostate biopsy  Date of Surgery:  Clearance TBD                                 Surgeon:  Dr. Rhoderick Moody Surgeon's Group or Practice Name:  [facility] Phone number:  [phone] Fax number:  [phone]   Type of Clearance Requested:   - Medical  - Pharmacy:  Hold Aspirin and Warfarin (Coumadin) 5 days prior   Type of Anesthesia:  Not Indicated   Additional requests/questions:    Signed, Emmit Pomfret   06/06/2023, 9:48 AM

## 2023-06-08 NOTE — Telephone Encounter (Addendum)
 Steven Bowers CMA brought to me that a CMA in preop scheduled the pt inappropriately. Pt needs a new pt appt as last seen 2018. We will have to reach out to the pt with a new pt appt with an MD.    I will reach out to our scheduling team to see if they can help find a new pt appt for the pt for preop clearance.

## 2023-06-19 ENCOUNTER — Ambulatory Visit: Attending: Cardiovascular Disease | Admitting: Cardiovascular Disease

## 2023-06-19 ENCOUNTER — Encounter: Payer: Self-pay | Admitting: Cardiovascular Disease

## 2023-06-19 ENCOUNTER — Ambulatory Visit: Admitting: Nurse Practitioner

## 2023-06-19 VITALS — BP 152/80 | HR 54 | Ht 66.0 in | Wt 153.0 lb

## 2023-06-19 DIAGNOSIS — I10 Essential (primary) hypertension: Secondary | ICD-10-CM

## 2023-06-19 DIAGNOSIS — I359 Nonrheumatic aortic valve disorder, unspecified: Secondary | ICD-10-CM

## 2023-06-19 DIAGNOSIS — E7800 Pure hypercholesterolemia, unspecified: Secondary | ICD-10-CM

## 2023-06-19 DIAGNOSIS — Z01818 Encounter for other preprocedural examination: Secondary | ICD-10-CM | POA: Diagnosis not present

## 2023-06-19 MED ORDER — PRAVASTATIN SODIUM 40 MG PO TABS: 40.0000 mg | ORAL_TABLET | Freq: Every day | ORAL | 3 refills | Status: AC

## 2023-06-19 NOTE — Progress Notes (Signed)
 06/19/2023 Steven Bowers   09/18/1955  [id number]  Primary Physician Steven James, MD Primary Cardiologist: Steven Gess MD Steven Bowers, [place]  HPI:  Steven Bowers is a 68 y.o. 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  He was a former patient of Dr. Norris Bowers.  He is referred for preoperative clearance before prostate biopsy.  He has a history of [employer] AVR by Dr. Maren Bowers 6/24/await for aortic insufficiency.  He also had bruit replacement for ascending thoracic aortic aneurysm.  He had a cardiac catheterization performed Dr. Mayford Bowers prior to that that revealed normal coronary arteries.  He in addition has treated hypertension and hyperlipidemia.  He is never had a heart attack or stroke.  He is fairly active and walks with his wife 2 miles a day without limitation.   Current Meds  Medication Sig   aspirin EC 81 MG tablet Take 81 mg by mouth daily.   Biotin 5 MG CAPS Take 5 mg by mouth every other day.    fish oil-omega-3 fatty acids 1000 MG capsule Take 1 g by mouth daily.   metoprolol tartrate (LOPRESSOR) 25 MG tablet TAKE 1 TABLET BY MOUTH TWICE A DAY   Multiple Vitamins-Minerals (CENTRUM SPECIALIST HEART PO) Take 1 tablet by mouth every other day.    pravastatin (PRAVACHOL) 80 MG tablet Take 1 tablet (80 mg total) by mouth daily. Please make overdue yearly appt with Dr. Mendel Bowers before anymore refills. 2nd attempt (Patient taking differently: Take 40 mg by mouth daily. Please make overdue yearly appt with Dr. Mendel Bowers before anymore refills. 2nd attempt)   warfarin (COUMADIN) 10 MG tablet Take 10 mg by mouth every other day. PATIENT TAKES 10 MG TABLET ON Tuesday, Thursday, AND Saturday AND THEN 5 MG TABLET ON Monday, Wednesday,FRIDAY, SUNDAY     Allergies  Allergen Reactions   Codimal-A [Brompheniramine]     unknown   Latex Other (See  Comments)    unknown   Penicillins     Doesn't recall   Sulfa Antibiotics     unknown    Social History   Socioeconomic History   Marital status: Married    Spouse name: Not on file   Number of children: Not on file   Years of education: Not on file   Highest education level: Not on file  Occupational History   Not on file  Tobacco Use   Smoking status: Never    Passive exposure: Never   Smokeless tobacco: Never  Vaping Use   Vaping status: Never Used  Substance and Sexual Activity   Alcohol use: No   Drug use: No   Sexual activity: Not Currently  Other Topics Concern   Not on file  Social History Narrative   Not on file   Social Drivers of Health   Financial Resource Strain: Not on file  Food Insecurity: Not on file  Transportation Needs: Not on file  Physical Activity: Not on file  Stress: Not on file  Social Connections: Not on file  Intimate Partner Violence: Not on file     Review of Systems: General: negative for chills, fever, night sweats or weight changes.  Cardiovascular: negative for chest pain, dyspnea on exertion, edema, orthopnea, palpitations, paroxysmal nocturnal dyspnea or shortness of breath Dermatological: negative for rash Respiratory: negative for cough or wheezing Urologic: negative for hematuria Abdominal:  negative for nausea, vomiting, diarrhea, bright red blood per rectum, melena, or hematemesis Neurologic: negative for visual changes, syncope, or dizziness All other systems reviewed and are otherwise negative except as noted above.    Blood pressure (!) 152/80, pulse (!) 54, height 5\' 6"  (1.676 m), weight 153 lb (69.4 kg), SpO2 96%.  General appearance: alert and no distress Neck: no adenopathy, no carotid bruit, no JVD, supple, symmetrical, trachea midline, and thyroid not enlarged, symmetric, no tenderness/mass/nodules Lungs: clear to auscultation bilaterally Heart: Crisp mechanical valve sounds Extremities: extremities normal,  atraumatic, no cyanosis or edema Pulses: 2+ and symmetric Skin: Skin color, texture, turgor normal. No rashes or lesions Neurologic: Grossly normal  EKG EKG Interpretation Date/Time:  Monday June 19 2023 08:40:19 EDT Ventricular Rate:  54 PR Interval:  170 QRS Duration:  104 QT Interval:  418 QTC Calculation: 396 R Axis:   6  Text Interpretation: Sinus bradycardia When compared with ECG of 13-Sep-2006 07:10, Non-specific change in ST segment in Inferior leads T wave inversion no longer evident in Inferior leads QT has shortened Confirmed by Steven Bowers [phone]) on 06/19/2023 8:44:05 AM    ASSESSMENT AND PLAN:   Aortic valve disorder History of severe AI status post aortic root and valve replacement by Dr. Maren Bowers 09/12/2006.  He had cardiac catheterization prior to that by Dr. Mayford Bowers which did not reveal any CAD.  He is on warfarin anticoagulation followed by his PCP.  Will recheck a 2D echocardiogram.  Hypercholesteremia History of hyperlipidemia on pravastatin with lipid profile performed 01/06/2023 revealing total cholesterol of 163, LDL of 98 and HDL 43.  Hypertension History of essential hypertension with blood pressure measured today at 152/80.  He says he checks his blood pressure at home and it usually in the 130/80 range.  He is on metoprolol.  Preoperative clearance PSA has steadily increased now at 4.5.  He is seeing a urologist who wants to do a prostate biopsy.  He is on warfarin anticoagulation and has a Retail buyer AVR.  We will check a 2D echocardiogram.  He will need Lovenox bridging.     Steven Gess MD FACP,FACC,FAHA, [facility] 06/19/2023 8:59 AM

## 2023-06-19 NOTE — Assessment & Plan Note (Signed)
 History of hyperlipidemia on pravastatin with lipid profile performed 01/06/2023 revealing total cholesterol of 163, LDL of 98 and HDL 43.

## 2023-06-19 NOTE — Patient Instructions (Addendum)
 Medication Instructions:  Your physician recommends that you continue on your current medications as directed. Please refer to the Current Medication list given to you today.  *If you need a refill on your cardiac medications before your next appointment, please call your pharmacy*  Testing/Procedures: Your physician has requested that you have an echocardiogram. Echocardiography is a painless test that uses sound waves to create images of your heart. It provides your doctor with information about the size and shape of your heart and how well your heart's chambers and valves are working. This procedure takes approximately one hour. There are no restrictions for this procedure. Please do NOT wear cologne, perfume, aftershave, or lotions (deodorant is allowed). Please arrive 15 minutes prior to your appointment time.  Please note: We ask at that you not bring children with you during ultrasound (echo/ vascular) testing. Due to room size and safety concerns, children are not allowed in the ultrasound rooms during exams. Our front office staff cannot provide observation of children in our lobby area while testing is being conducted. An adult accompanying a patient to their appointment will only be allowed in the ultrasound room at the discretion of the ultrasound technician under special circumstances. We apologize for any inconvenience.   Follow-Up: At [facility], you and your health needs are our priority.  As part of our continuing mission to provide you with exceptional heart care, our providers are all part of one team.  This team includes your primary Cardiologist (physician) and Advanced Practice Providers or APPs (Physician Assistants and Nurse Practitioners) who all work together to provide you with the care you need, when you need it.  Your next appointment:   12 month(s)  Provider:   Nanetta Batty, MD   We recommend signing up for the patient portal called "MyChart".  Sign up  information is provided on this After Visit Summary.  MyChart is used to connect with patients for Virtual Visits (Telemedicine).  Patients are able to view lab/test results, encounter notes, upcoming appointments, etc.  Non-urgent messages can be sent to your provider as well.   To learn more about what you can do with MyChart, go to [email or web address].   Other Instructions Pending echo results we will contact you regarding lovenox bridge for your procedure.       1st Floor: - Lobby - Registration  - Pharmacy  - Lab - Cafe  2nd Floor: - PV Lab - Diagnostic Testing (echo, CT, nuclear med)  3rd Floor: - Vacant  4th Floor: - TCTS (cardiothoracic surgery) - AFib Clinic - Structural Heart Clinic - Vascular Surgery  - Vascular Ultrasound  5th Floor: - [facility] (general and EP) - Clinical Pharmacy for coumadin, hypertension, lipid, weight-loss medications, and med management appointments    Valet parking services will be available as well.

## 2023-06-19 NOTE — Addendum Note (Signed)
 Addended by: Bernita Buffy on: 06/19/2023 09:09 AM   Modules accepted: Orders

## 2023-06-19 NOTE — Assessment & Plan Note (Signed)
 PSA has steadily increased now at 4.5.  He is seeing a urologist who wants to do a prostate biopsy.  He is on warfarin anticoagulation and has a Retail buyer AVR.  We will check a 2D echocardiogram.  He will need Lovenox bridging.

## 2023-06-19 NOTE — Assessment & Plan Note (Signed)
 History of essential hypertension with blood pressure measured today at 152/80.  He says he checks his blood pressure at home and it usually in the 130/80 range.  He is on metoprolol.

## 2023-06-19 NOTE — Assessment & Plan Note (Signed)
 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 Maren Beach 09/12/2006.  He had cardiac catheterization prior to that by Dr. Mayford Knife which did not reveal any CAD.  He is on warfarin anticoagulation followed by his PCP.  Will recheck a 2D echocardiogram.

## 2023-06-21 NOTE — Telephone Encounter (Signed)
 Will update the surgeons office, patient is to have an echo on 4/29. Once patient has been cleared, we will fax note once patient is cleared. Please see note, cardiology does not follow coumadin. Primary care does this.

## 2023-06-21 NOTE — Telephone Encounter (Signed)
 Sharlene Dory, PA-C  Bernita Buffy, RN Caller: Unspecified (2 weeks ago) Per pharmacy we do not prescribe the coumadin. Bridge orders will need to come from prescribing MD.  I will remove from pool, thanks.  Sharlene Dory, PA-C

## 2023-06-22 NOTE — Telephone Encounter (Signed)
Office calling to f/u on Clearance. Please advise

## 2023-06-22 NOTE — Telephone Encounter (Signed)
 Spoke with front desk rep at [facility]. I did advise her that once patient has completed echo on 07/18/2023, we will send any notes as to whether or not he cleared.

## 2023-07-18 ENCOUNTER — Ambulatory Visit (HOSPITAL_COMMUNITY): Attending: Internal Medicine

## 2023-07-18 DIAGNOSIS — I10 Essential (primary) hypertension: Secondary | ICD-10-CM | POA: Insufficient documentation

## 2023-07-18 DIAGNOSIS — I359 Nonrheumatic aortic valve disorder, unspecified: Secondary | ICD-10-CM | POA: Diagnosis not present

## 2023-07-18 DIAGNOSIS — Z01818 Encounter for other preprocedural examination: Secondary | ICD-10-CM | POA: Diagnosis not present

## 2023-07-18 DIAGNOSIS — E7800 Pure hypercholesterolemia, unspecified: Secondary | ICD-10-CM | POA: Diagnosis not present

## 2023-07-20 LAB — ECHOCARDIOGRAM COMPLETE
AR max vel: 1.69 cm2
AV Area VTI: 1.91 cm2
AV Area mean vel: 1.78 cm2
AV Mean grad: 10.3 mmHg
AV Peak grad: 22.2 mmHg
Ao pk vel: 2.36 m/s
Area-P 1/2: 3.87 cm2
S' Lateral: 2.8 cm

## 2023-07-21 ENCOUNTER — Telehealth: Payer: Self-pay | Admitting: Cardiovascular Disease

## 2023-07-21 NOTE — Telephone Encounter (Signed)
 Caller (Pattie) is following up on status of patient's clearance.

## 2023-07-21 NOTE — Telephone Encounter (Signed)
 I will forward to preop APP to review if the pt has been cleared.

## 2023-07-24 NOTE — Telephone Encounter (Signed)
   Primary Cardiologist: Lauro Portal, MD  Chart reviewed as part of pre-operative protocol coverage. Given past medical history and time since last visit, based on ACC/AHA guidelines, Steven Bowers would be at acceptable risk for the planned procedure without further cardiovascular testing.   Patient was advised that if he develops new symptoms prior to surgery to contact our office to arrange a follow-up appointment.  He verbalized understanding.  Patient will need Lovenox bridging which will be arranged by our pharmacy team.   I will route this recommendation to the requesting party via Epic fax function and remove from pre-op pool.  Please call with questions.  Gerldine Koch, NP-C 07/24/2023, 8:00 AM [address] Office [phone] Fax [phone]

## 2023-07-25 NOTE — Telephone Encounter (Signed)
 We do not manage patient's warfarin/INR. Bridging instructions need to come from Dr Davy Estimable office

## 2023-07-26 NOTE — Telephone Encounter (Signed)
 Steven Bowers from [employer] [phone] requesting cb for clarification on surg forms faxed back

## 2023-07-26 NOTE — Telephone Encounter (Signed)
 Chart reviewed as part of pre-operative protocol coverage. Given past medical history and time since last visit, based on ACC/AHA guidelines, Steven Bowers would be at acceptable risk for the planned procedure without further cardiovascular testing.    Patient was advised that if he develops new symptoms prior to surgery to contact our office to arrange a follow-up appointment.  He verbalized understanding.   Instructions regarding management of Coumadin  need to come from prescribing provider.   I will route this recommendation to the requesting party via Epic fax function and remove from pre-op pool.   Please call with questions.  Gerldine Koch, NP-C  07/26/2023, 7:36 AM [address] [place], [place] [address] Office [phone] Fax [phone]

## 2023-07-26 NOTE — Telephone Encounter (Signed)
   Primary Cardiologist: Lauro Portal, MD  Chart reviewed as part of pre-operative protocol coverage. Given past medical history and time since last visit, based on ACC/AHA guidelines, Steven Bowers would be at acceptable risk for the planned procedure without further cardiovascular testing.   Patient was advised that if he develops new symptoms prior to surgery to contact our office to arrange a follow-up appointment.  He verbalized understanding.  Instructions regarding management of Coumadin  need to come from prescribing provider.  I will route this recommendation to the requesting party via Epic fax function and remove from pre-op pool.  Please call with questions.  Gerldine Koch, NP-C  07/26/2023, 7:35 AM [address] [place], [place] [address] Office [phone] Fax [phone]

## 2023-07-26 NOTE — Telephone Encounter (Signed)
 I tried to call the requesting office however they were closed.   Please see notes from the preop APP and pharm-d in regard to Warfarin not followed by our practice.

## 2023-07-28 NOTE — Telephone Encounter (Signed)
 S/w Steven Bowers who had a question about Warfarin and Lovenox bridge. I reviewed notes with Steven Bowers who gave verbal understanding that they will need to reach out to Dr. Paulene Boron for clearance for warfarin and Lovenox bridge. I read per Dr. Katheryne Pane he has cleared the pt.   I will fax these notes to Steven Bowers as FYI.

## 2023-10-23 ENCOUNTER — Telehealth: Payer: Self-pay | Admitting: *Deleted

## 2023-10-23 NOTE — Telephone Encounter (Signed)
   Pre-operative Risk Assessment    Patient Name: Steven Bowers  DOB: [birth year] MRN: [id number]   Date of last office visit: 06/19/2023 Date of next office visit: N/A   Request for Surgical Clearance    Procedure:  PROSTATE BIOPSY  Date of Surgery:  Clearance TBD                                Surgeon:  DR. LONNI HAN Surgeon's Group or Practice Name:  [facility] Phone number:  [phone] Fax number:  [phone]   Type of Clearance Requested:   - Medical  - Pharmacy:  Hold Aspirin and Warfarin (Coumadin ) X'S 5 DAYS PRIOR   Type of Anesthesia:  Not Indicated   Additional requests/questions:    [facility]   10/23/2023, 3:48 PM

## 2023-11-02 ENCOUNTER — Telehealth: Payer: Self-pay | Admitting: Cardiovascular Disease

## 2023-11-02 NOTE — Telephone Encounter (Signed)
 Patient wants a call back to discuss getting a Lovenox bridge.

## 2023-11-02 NOTE — Telephone Encounter (Signed)
 Patient INR followed by Dr. Austin.  Please reach out to that office about holding warfarin

## 2023-11-02 NOTE — Telephone Encounter (Signed)
 Called patient and let him know his PCP coumadin  clinic will help him w the Lovenox bridge.  He voices understanding and agreement.

## 2023-11-02 NOTE — Telephone Encounter (Signed)
   Name: Steven Bowers  DOB: 13-Nov-1955  MRN: [id number]  Primary Cardiologist: Dorn Lesches, MD   Preoperative team, please contact this patient and set up a phone call appointment for further preoperative risk assessment. Please obtain consent and complete medication review. Thank you for your help.  I confirm that guidance regarding antiplatelet and oral anticoagulation therapy has been completed and, if necessary, noted below.  Patient INR followed by Dr. Austin.  Please reach out to that office about holding warfarin.    I also confirmed the patient resides in the state of  . As per [facility] Medical Board telemedicine laws, the patient must reside in the state in which the provider is licensed.   [name], NP 11/02/2023, 12:46 PM [facility]

## 2023-11-02 NOTE — Telephone Encounter (Signed)
 Tried contacting patient to schedule TELEVISIT no answer left a detailed vm to call back and schedule

## 2023-11-02 NOTE — Telephone Encounter (Signed)
 Pt called the office today about Lovenox.  I will forward this to pharm-d. See notes below.    I reviewed the clearance request in the chart and reviewed notes from pharm-D, Abigail Mink who states: Alvstad, Kristin L, RPH-CPP    11/02/23  8:00 AM Note Patient INR followed by Dr. Austin.  Please reach out to that office about holding warfarin

## 2023-11-03 ENCOUNTER — Telehealth: Payer: Self-pay

## 2023-11-03 NOTE — Telephone Encounter (Signed)
 Preop tele appt now scheduled, med rec and consent done.

## 2023-11-03 NOTE — Telephone Encounter (Signed)
  Patient Consent for Virtual Visit        Steven Bowers has provided verbal consent on 11/03/2023 for a virtual visit (video or telephone).   CONSENT FOR VIRTUAL VISIT FOR:  Steven Bowers  By participating in this virtual visit I agree to the following:  I hereby voluntarily request, consent and authorize [facility] and its employed or contracted physicians, physician assistants, nurse practitioners or other licensed health care professionals (the Practitioner), to provide me with telemedicine health care services (the "Services) as deemed necessary by the treating Practitioner. I acknowledge and consent to receive the Services by the Practitioner via telemedicine. I understand that the telemedicine visit will involve communicating with the Practitioner through live audiovisual communication technology and the disclosure of certain medical information by electronic transmission. I acknowledge that I have been given the opportunity to request an in-person assessment or other available alternative prior to the telemedicine visit and am voluntarily participating in the telemedicine visit.  I understand that I have the right to withhold or withdraw my consent to the use of telemedicine in the course of my care at any time, without affecting my right to future care or treatment, and that the Practitioner or I may terminate the telemedicine visit at any time. I understand that I have the right to inspect all information obtained and/or recorded in the course of the telemedicine visit and may receive copies of available information for a reasonable fee.  I understand that some of the potential risks of receiving the Services via telemedicine include:  Delay or interruption in medical evaluation due to technological equipment failure or disruption; Information transmitted may not be sufficient (e.g. poor resolution of images) to allow for appropriate medical decision making by the  Practitioner; and/or  In rare instances, security protocols could fail, causing a breach of personal health information.  Furthermore, I acknowledge that it is my responsibility to provide information about my medical history, conditions and care that is complete and accurate to the best of my ability. I acknowledge that Practitioner's advice, recommendations, and/or decision may be based on factors not within their control, such as incomplete or inaccurate data provided by me or distortions of diagnostic images or specimens that may result from electronic transmissions. I understand that the practice of medicine is not an exact science and that Practitioner makes no warranties or guarantees regarding treatment outcomes. I acknowledge that a copy of this consent can be made available to me via my patient portal [facility] MyChart), or I can request a printed copy by calling the office of  [facility].    I understand that my insurance will be billed for this visit.   I have read or had this consent read to me. I understand the contents of this consent, which adequately explains the benefits and risks of the Services being provided via telemedicine.  I have been provided ample opportunity to ask questions regarding this consent and the Services and have had my questions answered to my satisfaction. I give my informed consent for the services to be provided through the use of telemedicine in my medical care

## 2023-11-03 NOTE — Telephone Encounter (Signed)
 Pt returning call to schedule Pre Op Tele Visit. Please advise

## 2023-11-15 ENCOUNTER — Ambulatory Visit: Attending: Cardiovascular Disease

## 2023-11-15 DIAGNOSIS — Z01810 Encounter for preprocedural cardiovascular examination: Secondary | ICD-10-CM

## 2023-11-15 NOTE — Progress Notes (Signed)
 Virtual Visit via Telephone Note   Because of Steven Bowers co-morbid illnesses, he is at least at moderate risk for complications without adequate follow up.  This format is felt to be most appropriate for this patient at this time.  Due to technical limitations with video connection (technology), today's appointment will be conducted as an audio only telehealth visit, and [name] verbally agreed to proceed in this manner.   All issues noted in this document were discussed and addressed.  No physical exam could be performed with this format.  Evaluation Performed:  Preoperative cardiovascular risk assessment _____________   Date:  11/15/2023   Patient ID:  Steven Bowers, DOB 14-Aug-1955, MRN [id number] Patient Location:  Home Provider location:   Office  Primary Care Provider:  Sun, Vyvyan, MD Primary Cardiologist:  Dorn Lesches, MD  Chief Complaint / Patient Profile   68 y.o. 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  History of Present Illness    Steven Bowers is a 68 y.o. male who presents via audio/video conferencing for a telehealth visit today.  Pt was last seen in cardiology clinic on 06/19/2023 by Dr. Lesches.  At that time [name] was doing well with no new cardiac complaints.  He underwent preoperative clearance at that visit 2D echo completed showing normal EF normal aortic prosthesis function.  The patient is now pending procedure as outlined above. Since his last visit, he reports doing well with no new cardiac complaints.  He denies chest pain, shortness of breath, lower extremity edema, fatigue, palpitations, melena, hematuria, hemoptysis, diaphoresis, weakness, presyncope, syncope, orthopnea, and PND.    Past Medical History    Past Medical History:  Diagnosis  Date   Allergic rhinitis    Aortic root aneurysm    status post resection with aortic conduit and replacement, followed by Dr. Wilbert [name], cardiology   Aortic valve disorders    severe aortic insufficiency status post mechanical aortic valve replacement, followed by Dr. Wilbert [name], cardiology, 2008   Benign hypertensive heart disease without heart failure    Hypercholesteremia    Hypertension    Past Surgical History:  Procedure Laterality Date   aortic root aneurysm resection     AORTIC VALVE REPLACEMENT  2008   mechanical   CARDIAC CATHETERIZATION      Allergies  Allergies  Allergen Reactions   Codimal-A [Brompheniramine]     unknown   Latex Other (See Comments)    unknown   Penicillins     Doesn't recall   Sulfa Antibiotics     unknown    Home Medications    Prior to Admission medications   Medication Sig Start Date End Date Taking? Authorizing Provider  aspirin EC 81 MG tablet Take 81 mg by mouth daily.    [provider]  fish oil-omega-3 fatty acids 1000 MG capsule Take 1 g by mouth daily.    [provider]  metoprolol  tartrate (LOPRESSOR ) 25 MG tablet TAKE 1 TABLET BY MOUTH TWICE A DAY 05/08/17   Turner, [name], MD  Multiple Vitamins-Minerals (CENTRUM SPECIALIST HEART PO) Take 1 tablet by mouth every other day.     [provider]  pravastatin  (PRAVACHOL ) 40 MG tablet Take 1 tablet (40 mg total) by mouth daily. Please make overdue yearly appt with Dr.  Tuner before anymore refills. 2nd attempt 06/19/23   Court Dorn PARAS, MD  warfarin (COUMADIN ) 10 MG tablet Take 10 mg by mouth every other day. PATIENT TAKES 10 MG TABLET ON Tuesday, Thursday, AND Saturday AND THEN 5 MG TABLET ON Monday, Wednesday,FRIDAY, SUNDAY    [provider]    Physical Exam    Vital Signs:  [name] does not have vital signs available for review today.130/80  Given telephonic nature of communication, physical exam is limited. AAOx3. NAD.  Normal affect.  Speech and respirations are unlabored.  Accessory Clinical Findings    None  Assessment & Plan    1.  Preoperative Cardiovascular Risk Assessment: - Patient's RCRI score is 0.9%  The patient affirms he has been doing well without any new cardiac symptoms. They are able to achieve 7 METS without cardiac limitations. Therefore, based on ACC/AHA guidelines, the patient would be at acceptable risk for the planned procedure without further cardiovascular testing. The patient was advised that if he develops new symptoms prior to surgery to contact our office to arrange for a follow-up visit, and he verbalized understanding.   The patient was advised that if he develops new symptoms prior to surgery to contact our office to arrange for a follow-up visit, and he verbalized understanding.  Guidance for holding warfarin will come from Dr. Austin who is currently managing his INRs and prescribing medication.  A copy of this note will be routed to requesting surgeon.  Time:   Today, I have spent 6 minutes with the patient with telehealth technology discussing medical history, symptoms, and management plan.     Wyn Raddle, Jackee Shove, NP  11/15/2023, 7:03 AM
